# Patient Record
Sex: Female | Born: 1965
Health system: Southern US, Community
[De-identification: ages and names within clinical notes are randomized; demographics above are authoritative.]

## PROBLEM LIST (undated history)

## (undated) DIAGNOSIS — F32A Depression, unspecified: Secondary | ICD-10-CM

## (undated) DIAGNOSIS — I10 Essential (primary) hypertension: Secondary | ICD-10-CM

## (undated) DIAGNOSIS — E785 Hyperlipidemia, unspecified: Secondary | ICD-10-CM

## (undated) DIAGNOSIS — E119 Type 2 diabetes mellitus without complications: Secondary | ICD-10-CM

## (undated) DIAGNOSIS — T7840XA Allergy, unspecified, initial encounter: Secondary | ICD-10-CM

## (undated) DIAGNOSIS — F419 Anxiety disorder, unspecified: Secondary | ICD-10-CM

## (undated) DIAGNOSIS — Z860100 Personal history of colon polyps, unspecified: Secondary | ICD-10-CM

## (undated) DIAGNOSIS — F329 Major depressive disorder, single episode, unspecified: Secondary | ICD-10-CM

## (undated) DIAGNOSIS — Z9889 Other specified postprocedural states: Secondary | ICD-10-CM

## (undated) HISTORY — DX: Hyperlipidemia, unspecified: E78.5

## (undated) HISTORY — DX: Personal history of colon polyps, unspecified: Z86.0100

## (undated) HISTORY — DX: Allergy, unspecified, initial encounter: T78.40XA

## (undated) HISTORY — DX: Other specified postprocedural states: Z98.890

## (undated) HISTORY — DX: Major depressive disorder, single episode, unspecified: F32.9

## (undated) HISTORY — DX: Anxiety disorder, unspecified: F41.9

## (undated) HISTORY — DX: Type 2 diabetes mellitus without complications: E11.9

## (undated) HISTORY — DX: Depression, unspecified: F32.A

## (undated) HISTORY — PX: CARPAL TUNNEL RELEASE: SHX101

## (undated) HISTORY — DX: Essential (primary) hypertension: I10

---
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
## 1990-04-30 HISTORY — PX: TONSILLECTOMY: SUR1361

## 1998-04-11 ENCOUNTER — Other Ambulatory Visit: Admission: RE | Admit: 1998-04-11 | Discharge: 1998-04-11 | Payer: Self-pay | Admitting: Obstetrics and Gynecology

## 1999-05-26 ENCOUNTER — Other Ambulatory Visit: Admission: RE | Admit: 1999-05-26 | Discharge: 1999-05-26 | Payer: Self-pay | Admitting: Obstetrics and Gynecology

## 1999-07-21 ENCOUNTER — Encounter: Payer: Self-pay | Admitting: Obstetrics and Gynecology

## 1999-07-21 ENCOUNTER — Ambulatory Visit (HOSPITAL_COMMUNITY): Admission: RE | Admit: 1999-07-21 | Discharge: 1999-07-21 | Payer: Self-pay | Admitting: Obstetrics and Gynecology

## 1999-09-20 ENCOUNTER — Ambulatory Visit (HOSPITAL_COMMUNITY): Admission: RE | Admit: 1999-09-20 | Discharge: 1999-09-20 | Payer: Self-pay | Admitting: Obstetrics and Gynecology

## 1999-09-29 ENCOUNTER — Encounter: Admission: RE | Admit: 1999-09-29 | Discharge: 1999-12-28 | Payer: Self-pay | Admitting: Obstetrics and Gynecology

## 1999-11-30 ENCOUNTER — Inpatient Hospital Stay (HOSPITAL_COMMUNITY): Admission: AD | Admit: 1999-11-30 | Discharge: 1999-11-30 | Payer: Self-pay | Admitting: Obstetrics and Gynecology

## 1999-12-05 ENCOUNTER — Encounter (HOSPITAL_COMMUNITY): Admission: RE | Admit: 1999-12-05 | Discharge: 1999-12-08 | Payer: Self-pay | Admitting: Obstetrics and Gynecology

## 1999-12-08 ENCOUNTER — Inpatient Hospital Stay (HOSPITAL_COMMUNITY): Admission: AD | Admit: 1999-12-08 | Discharge: 1999-12-11 | Payer: Self-pay | Admitting: Obstetrics and Gynecology

## 2000-05-07 ENCOUNTER — Other Ambulatory Visit: Admission: RE | Admit: 2000-05-07 | Discharge: 2000-05-07 | Payer: Self-pay | Admitting: Obstetrics and Gynecology

## 2001-07-01 ENCOUNTER — Other Ambulatory Visit: Admission: RE | Admit: 2001-07-01 | Discharge: 2001-07-01 | Payer: Self-pay | Admitting: Obstetrics and Gynecology

## 2002-04-29 ENCOUNTER — Ambulatory Visit (HOSPITAL_BASED_OUTPATIENT_CLINIC_OR_DEPARTMENT_OTHER): Admission: RE | Admit: 2002-04-29 | Discharge: 2002-04-29 | Payer: Self-pay | Admitting: Orthopedic Surgery

## 2002-08-12 ENCOUNTER — Ambulatory Visit (HOSPITAL_BASED_OUTPATIENT_CLINIC_OR_DEPARTMENT_OTHER): Admission: RE | Admit: 2002-08-12 | Discharge: 2002-08-12 | Payer: Self-pay | Admitting: Orthopedic Surgery

## 2010-02-28 ENCOUNTER — Ambulatory Visit: Payer: Self-pay

## 2011-06-28 ENCOUNTER — Ambulatory Visit: Payer: Self-pay

## 2014-08-03 ENCOUNTER — Encounter: Payer: Self-pay | Admitting: *Deleted

## 2015-05-25 DIAGNOSIS — I10 Essential (primary) hypertension: Secondary | ICD-10-CM | POA: Diagnosis not present

## 2015-05-25 DIAGNOSIS — F418 Other specified anxiety disorders: Secondary | ICD-10-CM | POA: Diagnosis not present

## 2015-05-25 DIAGNOSIS — E784 Other hyperlipidemia: Secondary | ICD-10-CM | POA: Diagnosis not present

## 2015-05-25 DIAGNOSIS — E119 Type 2 diabetes mellitus without complications: Secondary | ICD-10-CM | POA: Diagnosis not present

## 2015-05-25 DIAGNOSIS — Z79899 Other long term (current) drug therapy: Secondary | ICD-10-CM | POA: Diagnosis not present

## 2015-05-31 DIAGNOSIS — E784 Other hyperlipidemia: Secondary | ICD-10-CM | POA: Diagnosis not present

## 2015-06-14 DIAGNOSIS — H524 Presbyopia: Secondary | ICD-10-CM | POA: Diagnosis not present

## 2015-09-05 ENCOUNTER — Encounter: Payer: Self-pay | Admitting: Physician Assistant

## 2015-09-05 ENCOUNTER — Ambulatory Visit: Payer: Self-pay | Admitting: Physician Assistant

## 2015-09-05 VITALS — BP 114/80 | HR 76 | Temp 98.9°F

## 2015-09-05 DIAGNOSIS — H6981 Other specified disorders of Eustachian tube, right ear: Secondary | ICD-10-CM

## 2015-09-05 MED ORDER — FLUTICASONE PROPIONATE 50 MCG/ACT NA SUSP: 2.0000 | Freq: Every day | NASAL | Status: DC

## 2015-09-05 MED ORDER — PREDNISONE 10 MG PO TABS: 30.0000 mg | ORAL_TABLET | Freq: Every day | ORAL | Status: DC

## 2015-09-05 NOTE — Progress Notes (Signed)
S:  C/o ears popping and being stopped up, no drainage from ears, no fever/chills, no cough or congestion, some sinus pressure, can't hear hardly at all from r ear;  remainder ros neg Using otc meds without relief  O:  Vitals wnl, nad, r ear canal + large amount of wax but not impacted, r tm upper rim is dull,  Left tm is a little dull, nasal mucosa swollen, throat wnl, neck supple no lymph, lungs c t a, cv rrr, neuro intact, irrigated r ear canal, wax removed  A: acute eustachean tube dysfunction, ear wax removal  P: flonase, children's dose sudafed, prednisone 30mg  qd x 3d, return if not improving in 3 to 5 days, return earlier if worsening

## 2015-12-14 DIAGNOSIS — Z1231 Encounter for screening mammogram for malignant neoplasm of breast: Secondary | ICD-10-CM | POA: Diagnosis not present

## 2015-12-14 DIAGNOSIS — Z130 Encounter for screening for diseases of the blood and blood-forming organs and certain disorders involving the immune mechanism: Secondary | ICD-10-CM | POA: Diagnosis not present

## 2015-12-14 DIAGNOSIS — Z01419 Encounter for gynecological examination (general) (routine) without abnormal findings: Secondary | ICD-10-CM | POA: Diagnosis not present

## 2015-12-14 DIAGNOSIS — Z124 Encounter for screening for malignant neoplasm of cervix: Secondary | ICD-10-CM | POA: Diagnosis not present

## 2015-12-14 DIAGNOSIS — Z3041 Encounter for surveillance of contraceptive pills: Secondary | ICD-10-CM | POA: Diagnosis not present

## 2015-12-14 DIAGNOSIS — Z1389 Encounter for screening for other disorder: Secondary | ICD-10-CM | POA: Diagnosis not present

## 2015-12-28 DIAGNOSIS — I10 Essential (primary) hypertension: Secondary | ICD-10-CM | POA: Diagnosis not present

## 2015-12-28 DIAGNOSIS — E784 Other hyperlipidemia: Secondary | ICD-10-CM | POA: Diagnosis not present

## 2015-12-28 DIAGNOSIS — F418 Other specified anxiety disorders: Secondary | ICD-10-CM | POA: Diagnosis not present

## 2015-12-28 DIAGNOSIS — E119 Type 2 diabetes mellitus without complications: Secondary | ICD-10-CM | POA: Diagnosis not present

## 2015-12-28 DIAGNOSIS — Z79899 Other long term (current) drug therapy: Secondary | ICD-10-CM | POA: Diagnosis not present

## 2016-04-11 DIAGNOSIS — E119 Type 2 diabetes mellitus without complications: Secondary | ICD-10-CM | POA: Diagnosis not present

## 2016-04-11 DIAGNOSIS — F418 Other specified anxiety disorders: Secondary | ICD-10-CM | POA: Diagnosis not present

## 2016-04-11 DIAGNOSIS — Z7984 Long term (current) use of oral hypoglycemic drugs: Secondary | ICD-10-CM | POA: Diagnosis not present

## 2016-05-02 ENCOUNTER — Encounter: Payer: Self-pay | Admitting: Physician Assistant

## 2016-05-02 ENCOUNTER — Ambulatory Visit: Payer: Self-pay | Admitting: Physician Assistant

## 2016-05-02 VITALS — BP 119/70 | HR 100 | Temp 98.4°F

## 2016-05-02 DIAGNOSIS — H65191 Other acute nonsuppurative otitis media, right ear: Secondary | ICD-10-CM

## 2016-05-02 MED ORDER — AMOXICILLIN-POT CLAVULANATE 875-125 MG PO TABS: 1.0000 | ORAL_TABLET | Freq: Two times a day (BID) | ORAL | 0 refills | Status: DC

## 2016-05-02 NOTE — Progress Notes (Signed)
S: c/o r ear pain with some drainage from ear that has a bad odor, mild low grade fever, no body aches, no cough or congestion, did have a cold a couple of weeks ago but now is better, , remainder ros negative  O: vitals wnl, nad, r tm dull with yellow pus, none in ear canal, left tm is dull, nasal mucosa a little swollen, throat wnl, neck supple no lymph, lungs c t a, cv rrr  A: acute otitis media  P: augmentin 875mg  bid, diflucan

## 2016-06-06 ENCOUNTER — Ambulatory Visit: Payer: Self-pay | Admitting: Physician Assistant

## 2016-06-06 VITALS — BP 110/70 | HR 80 | Temp 99.0°F

## 2016-06-06 DIAGNOSIS — H6982 Other specified disorders of Eustachian tube, left ear: Secondary | ICD-10-CM

## 2016-06-06 DIAGNOSIS — R509 Fever, unspecified: Secondary | ICD-10-CM

## 2016-06-06 LAB — POCT INFLUENZA A/B
INFLUENZA A, POC: NEGATIVE
INFLUENZA B, POC: NEGATIVE

## 2016-06-06 MED ORDER — PREDNISONE 10 MG PO TABS
30.0000 mg | ORAL_TABLET | Freq: Every day | ORAL | 0 refills | Status: DC
Start: 2016-06-06 — End: 2016-09-19

## 2016-06-06 NOTE — Progress Notes (Signed)
S:  C/o ears popping and being stopped up, no drainage from ears, no fever/chills, did have cough and congestion last week, some sinus pressure, no body aches or sore throat,  remainder ros neg Using otc meds without relief  O:  Vitals w low grade temp, nad, tms dull b/l, nasal mucosa swollen, throat wnl, neck supple no lymph, lungs c t a, cv rrr, neuro intact, flu swab neg  A: acute eustachean tube dysfunction  P: flonase, sudafed, prednisone 30mg  qd x 3d, return if not improving in 3 to 5 days, return earlier if worsening, if improves with 3 day burst but sx worsen when stopping, can call in steroid pack

## 2016-07-18 DIAGNOSIS — Z79899 Other long term (current) drug therapy: Secondary | ICD-10-CM | POA: Diagnosis not present

## 2016-07-18 DIAGNOSIS — E119 Type 2 diabetes mellitus without complications: Secondary | ICD-10-CM | POA: Diagnosis not present

## 2016-07-18 DIAGNOSIS — I10 Essential (primary) hypertension: Secondary | ICD-10-CM | POA: Diagnosis not present

## 2016-07-18 DIAGNOSIS — E784 Other hyperlipidemia: Secondary | ICD-10-CM | POA: Diagnosis not present

## 2016-07-18 DIAGNOSIS — Z7984 Long term (current) use of oral hypoglycemic drugs: Secondary | ICD-10-CM | POA: Diagnosis not present

## 2016-08-22 DIAGNOSIS — E784 Other hyperlipidemia: Secondary | ICD-10-CM | POA: Diagnosis not present

## 2016-08-22 DIAGNOSIS — E119 Type 2 diabetes mellitus without complications: Secondary | ICD-10-CM | POA: Diagnosis not present

## 2016-08-22 DIAGNOSIS — Z79899 Other long term (current) drug therapy: Secondary | ICD-10-CM | POA: Diagnosis not present

## 2016-08-29 DIAGNOSIS — N6321 Unspecified lump in the left breast, upper outer quadrant: Secondary | ICD-10-CM | POA: Diagnosis not present

## 2016-08-30 ENCOUNTER — Other Ambulatory Visit: Payer: Self-pay | Admitting: Obstetrics and Gynecology

## 2016-08-30 DIAGNOSIS — N63 Unspecified lump in unspecified breast: Secondary | ICD-10-CM

## 2016-09-04 ENCOUNTER — Ambulatory Visit
Admission: RE | Admit: 2016-09-04 | Discharge: 2016-09-04 | Disposition: A | Discharge status: Home / Self Care | Payer: Self-pay | Source: Ambulatory Visit | Attending: Obstetrics and Gynecology | Admitting: Obstetrics and Gynecology

## 2016-09-04 DIAGNOSIS — R928 Other abnormal and inconclusive findings on diagnostic imaging of breast: Secondary | ICD-10-CM | POA: Diagnosis not present

## 2016-09-04 DIAGNOSIS — N6489 Other specified disorders of breast: Secondary | ICD-10-CM | POA: Diagnosis not present

## 2016-09-04 DIAGNOSIS — N63 Unspecified lump in unspecified breast: Secondary | ICD-10-CM

## 2016-09-12 ENCOUNTER — Encounter: Payer: Self-pay | Admitting: General Surgery

## 2016-09-18 ENCOUNTER — Encounter: Payer: Self-pay | Admitting: *Deleted

## 2016-09-19 ENCOUNTER — Inpatient Hospital Stay: Payer: Self-pay

## 2016-09-19 ENCOUNTER — Ambulatory Visit (INDEPENDENT_AMBULATORY_CARE_PROVIDER_SITE_OTHER): Payer: 59 | Admitting: General Surgery

## 2016-09-19 ENCOUNTER — Encounter: Payer: Self-pay | Admitting: General Surgery

## 2016-09-19 VITALS — BP 150/90 | HR 98 | Resp 12 | Ht 63.0 in | Wt 197.0 lb

## 2016-09-19 DIAGNOSIS — N6321 Unspecified lump in the left breast, upper outer quadrant: Secondary | ICD-10-CM

## 2016-09-19 DIAGNOSIS — N6320 Unspecified lump in the left breast, unspecified quadrant: Secondary | ICD-10-CM

## 2016-09-19 NOTE — Patient Instructions (Addendum)
Follow up in 3 months with office ultrasound, unless area becomes symptomatic. The patient is aware to call back for any questions or concerns.

## 2016-09-19 NOTE — Progress Notes (Signed)
Patient ID: Tracy Burgess, female   DOB: 12/04/1965, 51 y.o.   MRN: [id number]  Chief Complaint  Patient presents with  . Mass    HPI Tracy Burgess is a 51 y.o. female.  who presents for a breast evaluation. The most recent mammogram and left breast ultrasound was done on 09-04-16. She noticed a left breast mass at the ned of April about the size of an M & M. No pain to the breast. Patient does perform regular self breast checks and gets regular mammograms done.   There is no history of trauma. The patient is a nurse in the endoscopy unit.  HPI  Past Medical History:  Diagnosis Date  . Allergy   . Anxiety   . Depression   . Diabetes mellitus without complication ([place])   . Hyperlipidemia   . Hypertension     Past Surgical History:  Procedure Laterality Date  . CARPAL TUNNEL RELEASE Bilateral [id number]  . CESAREAN SECTION  [id number]  . TONSILLECTOMY  1992  . tubs in ears  1971    Family History  Problem Relation Age of Onset  . Breast cancer Paternal Grandmother 48  . Diabetes Father     Social History Social History  Substance Use Topics  . Smoking status: Never Smoker  . Smokeless tobacco: Never Used  . Alcohol use 0.0 oz/week    Allergies  Allergen Reactions  . Sulfamethoxazole     Other reaction(s): Other (See Comments) A drop in blood sugar    Current Outpatient Prescriptions  Medication Sig Dispense Refill  . fenofibrate 54 MG tablet Take 54 mg by mouth daily.   5  . fluticasone (FLONASE) 50 MCG/ACT nasal spray Place 2 sprays into both nostrils daily. 16 g 6  . glipiZIDE (GLUCOTROL) 5 MG tablet Take 5 mg by mouth daily before breakfast.   5  . ibuprofen (ADVIL,MOTRIN) 200 MG tablet Take 200 mg by mouth every 6 (six) hours as needed.    Marland Kitchen lisinopril-hydrochlorothiazide (PRINZIDE,ZESTORETIC) 20-12.5 MG tablet Take 1 tablet by mouth daily.   5  . metFORMIN (GLUCOPHAGE-XR) 500 MG 24 hr tablet Take 1,000 mg by mouth daily with breakfast.   1  .  norethindrone (MICRONOR,CAMILA,ERRIN) 0.35 MG tablet Take 1 tablet by mouth daily.   13  . simvastatin (ZOCOR) 80 MG tablet Take 80 mg by mouth daily at 6 PM.   5  . venlafaxine XR (EFFEXOR-XR) 150 MG 24 hr capsule Take 150 mg by mouth daily with breakfast.   5   No current facility-administered medications for this visit.     Review of Systems Review of Systems  Constitutional: Negative.   Cardiovascular: Negative.     Blood pressure (!) 150/90, pulse 98, resp. rate 12, height 5\' 3"  (1.6 m), weight 197 lb (89.4 kg), last menstrual period 08/20/2016, SpO2 98 %.  Physical Exam Physical Exam  Constitutional: She is oriented to person, place, and time. She appears well-developed and well-nourished.  HENT:  Mouth/Throat: Oropharynx is clear and moist.  Eyes: Conjunctivae are normal. No scleral icterus.  Neck: Neck supple.  Cardiovascular: Normal rate, regular rhythm and normal heart sounds.   Pulmonary/Chest: Effort normal and breath sounds normal. Right breast exhibits no inverted nipple, no mass, no nipple discharge, no skin change and no tenderness. Left breast exhibits mass. Left breast exhibits no inverted nipple, no nipple discharge, no skin change and no tenderness.    Thickening right breast upper outer quadrant.   Lymphadenopathy:  She has no cervical adenopathy.    She has no axillary adenopathy.  Neurological: She is alert and oriented to person, place, and time.  Skin: Skin is warm and dry.  Psychiatric: Her behavior is normal.    Data Reviewed 09/04/2016 left breast diagnostic mammogram and ultrasound reviewed. Compared to the 06/28/2011 images in the retrieval system there is some consolidation of the residual breast parenchyma in the upper-outer quadrant of the left breast. No discernible nodularity. Ultrasound report is negative (no images).  Considering the focal thickening evident on clinical exam was elected to repeat the ultrasound. In the left breast at the  2:00 position, 9 cm from the nipple there is a hyperechoic area in the subcutaneous fat measuring up to 0.5 cm in diameter. This is above the level of the adjacent rest parenchyma. No increased vascular flow. In the seated position this area is more distinct measuring up to 0.74 cm, again isoechoic/hyperechoic with the adjacent tissue. This likely represents a focal area of inflamed subcutaneous fat. BI-RADS-3.    Assessment    Focal hyperechoic fat without discernible abnormality of the underlying breast parenchyma.    Plan    Observation versus surgical excision discussed, based on the ultrasound appearance would recommend the former. The importance of not manipulating the area to minimize persistent inflammation was reviewed.    Follow up in 3 months with office ultrasound, unless area becomes symptomatic. The patient is aware to call back for any questions or concerns.    HPI, Physical Exam, Assessment and Plan have been scribed under the direction and in the presence of Robert Bellow, MD.  Karie Fetch, RN  I have completed the exam and reviewed the above documentation for accuracy and completeness.  I agree with the above.  Haematologist has been used and any errors in dictation or transcription are unintentional.  Hervey Ard, M.D., F.A.C.S.  Robert Bellow 09/20/2016, 8:43 PM

## 2016-09-20 DIAGNOSIS — N6320 Unspecified lump in the left breast, unspecified quadrant: Secondary | ICD-10-CM | POA: Insufficient documentation

## 2016-09-25 ENCOUNTER — Encounter: Payer: Self-pay | Admitting: *Deleted

## 2016-11-06 ENCOUNTER — Ambulatory Visit: Payer: Self-pay | Admitting: Family

## 2016-12-19 DIAGNOSIS — Z3041 Encounter for surveillance of contraceptive pills: Secondary | ICD-10-CM | POA: Diagnosis not present

## 2016-12-19 DIAGNOSIS — Z1231 Encounter for screening mammogram for malignant neoplasm of breast: Secondary | ICD-10-CM | POA: Diagnosis not present

## 2016-12-19 DIAGNOSIS — Z1389 Encounter for screening for other disorder: Secondary | ICD-10-CM | POA: Diagnosis not present

## 2016-12-19 DIAGNOSIS — Z124 Encounter for screening for malignant neoplasm of cervix: Secondary | ICD-10-CM | POA: Diagnosis not present

## 2016-12-19 DIAGNOSIS — Z130 Encounter for screening for diseases of the blood and blood-forming organs and certain disorders involving the immune mechanism: Secondary | ICD-10-CM | POA: Diagnosis not present

## 2016-12-19 DIAGNOSIS — Z01419 Encounter for gynecological examination (general) (routine) without abnormal findings: Secondary | ICD-10-CM | POA: Diagnosis not present

## 2016-12-20 DIAGNOSIS — Z124 Encounter for screening for malignant neoplasm of cervix: Secondary | ICD-10-CM | POA: Diagnosis not present

## 2016-12-26 ENCOUNTER — Encounter: Payer: Self-pay | Admitting: General Surgery

## 2016-12-26 ENCOUNTER — Ambulatory Visit (INDEPENDENT_AMBULATORY_CARE_PROVIDER_SITE_OTHER): Payer: 59 | Admitting: General Surgery

## 2016-12-26 VITALS — BP 124/82 | HR 76 | Resp 12 | Ht 63.0 in | Wt 195.0 lb

## 2016-12-26 DIAGNOSIS — N6321 Unspecified lump in the left breast, upper outer quadrant: Secondary | ICD-10-CM | POA: Diagnosis not present

## 2016-12-26 NOTE — Progress Notes (Signed)
Patient ID: Tracy Burgess, female   DOB: 09/03/1965, 51 y.o.   MRN: [id number]  Chief Complaint  Patient presents with  . Follow-up    HPI Tracy Burgess is a 51 y.o. female here today for her moth follow up left breast mass. Patient states no change in size or pain. Patient had a mammogram on 06/01/2016 in [place]  HPI  Past Medical History:  Diagnosis Date  . Allergy   . Anxiety   . Depression   . Diabetes mellitus without complication ([place])   . Hyperlipidemia   . Hypertension     Past Surgical History:  Procedure Laterality Date  . CARPAL TUNNEL RELEASE Bilateral [id number]  . CESAREAN SECTION  [id number]  . TONSILLECTOMY  1992  . tubes in ears  1971    Family History  Problem Relation Age of Onset  . Breast cancer Paternal Grandmother 52  . Diabetes Father     Social History Social History  Substance Use Topics  . Smoking status: Never Smoker  . Smokeless tobacco: Never Used  . Alcohol use 0.0 oz/week    Allergies  Allergen Reactions  . Sulfamethoxazole     Other reaction(s): Other (See Comments) A drop in blood sugar    Current Outpatient Prescriptions  Medication Sig Dispense Refill  . fenofibrate 54 MG tablet Take 54 mg by mouth daily.   5  . fluticasone (FLONASE) 50 MCG/ACT nasal spray Place 2 sprays into both nostrils daily. 16 g 6  . glipiZIDE (GLUCOTROL) 5 MG tablet Take 5 mg by mouth daily before breakfast.   5  . ibuprofen (ADVIL,MOTRIN) 200 MG tablet Take 200 mg by mouth every 6 (six) hours as needed.    Marland Kitchen lisinopril-hydrochlorothiazide (PRINZIDE,ZESTORETIC) 20-12.5 MG tablet Take 1 tablet by mouth daily.   5  . metFORMIN (GLUCOPHAGE-XR) 500 MG 24 hr tablet Take 1,000 mg by mouth daily with breakfast.   1  . norethindrone (MICRONOR,CAMILA,ERRIN) 0.35 MG tablet Take 1 tablet by mouth daily.   13  . simvastatin (ZOCOR) 80 MG tablet Take 80 mg by mouth daily at 6 PM.   5  . venlafaxine XR (EFFEXOR-XR) 150 MG 24 hr capsule Take 150 mg by mouth  daily with breakfast.   5   No current facility-administered medications for this visit.     Review of Systems Review of Systems  Blood pressure 124/82, pulse 76, resp. rate 12, height 5\' 3"  (1.6 m), weight 195 lb (88.5 kg).  Physical Exam Physical Exam  Constitutional: She is oriented to person, place, and time. She appears well-developed and well-nourished.  Eyes: Conjunctivae are normal. No scleral icterus.  Cardiovascular: Normal rate, regular rhythm and normal heart sounds.   Pulmonary/Chest: Effort normal and breath sounds normal. Right breast exhibits no inverted nipple, no mass, no nipple discharge, no skin change and no tenderness. Left breast exhibits mass. Left breast exhibits no inverted nipple, no nipple discharge, no skin change and no tenderness.    Abdominal: There is no tenderness.  Neurological: She is alert and oriented to person, place, and time.  Skin: Skin is warm and dry.    Data Reviewed Bilateral screening mammograms completed at the [facility] office dated 12/20/2006 reported as normal. BI-RADS-1. Films not available for review. Prior review of a left breast diagnostic mammogram and hospital-based ultrasound in May 2018 had been unremarkable. Office ultrasound of May 2018 showed hyperechoic tissue in the area of palpable thickening thought to represent traumatized fat.  Assessment  Benign breast exam.  Normal mammogram by report.    Plan    Patient will continue monthly self examinations and report of any change in the area occurs. No indication for biopsy at present.   Patient to return as needed. The patient is aware to call back for any questions or concerns.   HPI, Physical Exam, Assessment and Plan have been scribed under the direction and in the presence of Hervey Ard, MD.  Gaspar Cola, CMA  I have completed the exam and reviewed the above documentation for accuracy and completeness.  I agree with the above.  Development worker, community has been used and any errors in dictation or transcription are unintentional.  Hervey Ard, M.D., F.A.C.S.  Robert Bellow 12/27/2016, 7:46 AM

## 2016-12-26 NOTE — Patient Instructions (Signed)
Patient to return as needed. The patient is aware to call back for any questions or concerns. 

## 2016-12-27 ENCOUNTER — Encounter: Payer: Self-pay | Admitting: General Surgery

## 2017-01-02 DIAGNOSIS — F329 Major depressive disorder, single episode, unspecified: Secondary | ICD-10-CM | POA: Diagnosis not present

## 2017-01-02 DIAGNOSIS — E785 Hyperlipidemia, unspecified: Secondary | ICD-10-CM | POA: Diagnosis not present

## 2017-01-02 DIAGNOSIS — I10 Essential (primary) hypertension: Secondary | ICD-10-CM | POA: Diagnosis not present

## 2017-01-02 DIAGNOSIS — F419 Anxiety disorder, unspecified: Secondary | ICD-10-CM | POA: Diagnosis not present

## 2017-01-02 DIAGNOSIS — R5383 Other fatigue: Secondary | ICD-10-CM | POA: Diagnosis not present

## 2017-01-02 DIAGNOSIS — E119 Type 2 diabetes mellitus without complications: Secondary | ICD-10-CM | POA: Diagnosis not present

## 2017-01-08 DIAGNOSIS — R946 Abnormal results of thyroid function studies: Secondary | ICD-10-CM | POA: Diagnosis not present

## 2017-01-08 DIAGNOSIS — I10 Essential (primary) hypertension: Secondary | ICD-10-CM | POA: Diagnosis not present

## 2017-01-08 DIAGNOSIS — E119 Type 2 diabetes mellitus without complications: Secondary | ICD-10-CM | POA: Diagnosis not present

## 2017-01-08 DIAGNOSIS — R5383 Other fatigue: Secondary | ICD-10-CM | POA: Diagnosis not present

## 2017-01-08 DIAGNOSIS — E785 Hyperlipidemia, unspecified: Secondary | ICD-10-CM | POA: Diagnosis not present

## 2017-01-22 DIAGNOSIS — F411 Generalized anxiety disorder: Secondary | ICD-10-CM | POA: Diagnosis not present

## 2017-01-22 DIAGNOSIS — E785 Hyperlipidemia, unspecified: Secondary | ICD-10-CM | POA: Diagnosis not present

## 2017-01-22 DIAGNOSIS — E1165 Type 2 diabetes mellitus with hyperglycemia: Secondary | ICD-10-CM | POA: Diagnosis not present

## 2017-01-22 DIAGNOSIS — F329 Major depressive disorder, single episode, unspecified: Secondary | ICD-10-CM | POA: Diagnosis not present

## 2017-01-22 DIAGNOSIS — I10 Essential (primary) hypertension: Secondary | ICD-10-CM | POA: Diagnosis not present

## 2017-01-22 DIAGNOSIS — E039 Hypothyroidism, unspecified: Secondary | ICD-10-CM | POA: Diagnosis not present

## 2017-01-22 DIAGNOSIS — H6691 Otitis media, unspecified, right ear: Secondary | ICD-10-CM | POA: Diagnosis not present

## 2017-04-02 DIAGNOSIS — E038 Other specified hypothyroidism: Secondary | ICD-10-CM | POA: Diagnosis not present

## 2017-09-16 ENCOUNTER — Ambulatory Visit: Payer: Self-pay | Admitting: Adult Health

## 2017-09-16 ENCOUNTER — Encounter: Payer: Self-pay | Admitting: Adult Health

## 2017-09-16 VITALS — BP 130/74 | HR 93 | Temp 99.2°F | Wt 194.0 lb

## 2017-09-16 DIAGNOSIS — N390 Urinary tract infection, site not specified: Secondary | ICD-10-CM

## 2017-09-16 DIAGNOSIS — R81 Glycosuria: Secondary | ICD-10-CM | POA: Insufficient documentation

## 2017-09-16 DIAGNOSIS — R300 Dysuria: Secondary | ICD-10-CM

## 2017-09-16 DIAGNOSIS — R319 Hematuria, unspecified: Secondary | ICD-10-CM

## 2017-09-16 DIAGNOSIS — I10 Essential (primary) hypertension: Secondary | ICD-10-CM | POA: Insufficient documentation

## 2017-09-16 LAB — POCT URINALYSIS DIPSTICK
Bilirubin, UA: NEGATIVE
GLUCOSE UA: POSITIVE — AB
Ketones, UA: NEGATIVE
NITRITE UA: NEGATIVE
PROTEIN UA: POSITIVE — AB
Urobilinogen, UA: 0.2 E.U./dL
pH, UA: 5.5 (ref 5.0–8.0)

## 2017-09-16 MED ORDER — NITROFURANTOIN MONOHYD MACRO 100 MG PO CAPS
100.0000 mg | ORAL_CAPSULE | Freq: Two times a day (BID) | ORAL | 0 refills | Status: DC
Start: 2017-09-16 — End: 2020-02-02

## 2017-09-16 NOTE — Patient Instructions (Addendum)
Urinary Frequency, Adult Urinary frequency means urinating more often than usual. People with urinary frequency urinate at least 8 times in 24 hours, even if they drink a normal amount of fluid. Although they urinate more often than normal, the total amount of urine produced in a day may be normal. Urinary frequency is also called pollakiuria. What are the causes? This condition may be caused by:  A urinary tract infection.  Obesity.  Bladder problems, such as bladder stones.  Caffeine or alcohol.  Eating food or drinking fluids that irritate the bladder. These include coffee, tea, soda, artificial sweeteners, citrus, tomato-based foods, and chocolate.  Certain medicines, such as medicines that help the body get rid of extra fluid (diuretics).  Muscle or nerve weakness.  Overactive bladder.  Chronic diabetes.  Interstitial cystitis.  In men, problems with the prostate, such as an enlarged prostate.  In women, pregnancy.  In some cases, the cause may not be known. What increases the risk? This condition is more likely to develop in:  Women who have gone through menopause.  Men with prostate problems.  People with a disease or injury that affects the nerves or spinal cord.  People who have or have had a condition that affects the brain, such as a stroke.  What are the signs or symptoms? Symptoms of this condition include:  Feeling an urgent need to urinate often. The stress and anxiety of needing to find a bathroom quickly can make this urge worse.  Urinating 8 or more times in 24 hours.  Urinating as often as every 1 to 2 hours.  How is this diagnosed? This condition is diagnosed based on your symptoms, your medical history, and a physical exam. You may have tests, such as:  Blood tests.  Urine tests.  Imaging tests, such as X-rays or ultrasounds.  A bladder test.  A test of your neurological system. This is the body system that senses the need to  urinate.  A test to check for problems in the urethra and bladder called cystoscopy.  You may also be asked to keep a bladder diary. A bladder diary is a record of what you eat and drink, how often you urinate, and how much you urinate. You may need to see a health care provider who specializes in conditions of the urinary tract (urologist) or kidneys (nephrologist). How is this treated? Treatment for this condition depends on the cause. Sometimes the condition goes away on its own and treatment is not necessary. If treatment is needed, it may include:  Taking medicine.  Learning exercises that strengthen the muscles that help control urination.  Following a bladder training program. This may include: ? Learning to delay going to the bathroom. ? Double urinating (voiding). This helps if you are not completely emptying your bladder. ? Scheduled voiding.  Making diet changes, such as: ? Avoiding caffeine. ? Drinking fewer fluids, especially alcohol. ? Not drinking in the evening. ? Not having foods or drinks that may irritate the bladder. ? Eating foods that help prevent or ease constipation. Constipation can make this condition worse.  Having the nerves in your bladder stimulated. There are two options for stimulating the nerves to your bladder: ? Outpatient electrical nerve stimulation. This is done by your health care provider. ? Surgery to implant a bladder pacemaker. The pacemaker helps to control the urge to urinate.  Follow these instructions at home:  Keep a bladder diary if told to by your health care provider.  Take over-the-counter   and prescription medicines only as told by your health care provider.  Do any exercises as told by your health care provider.  Follow a bladder training program as told by your health care provider.  Make any recommended diet changes.  Keep all follow-up visits as told by your health care provider. This is important. Contact a health care  provider if:  You start urinating more often.  You feel pain or irritation when you urinate.  You notice blood in your urine.  Your urine looks cloudy.  You develop a fever.  You begin vomiting. Get help right away if:  You are unable to urinate. This information is not intended to replace advice given to you by your health care provider. Make sure you discuss any questions you have with your health care provider. Document Released: 02/10/2009 Document Revised: 05/18/2015 Document Reviewed: 11/10/2014 Elsevier Interactive Patient Education  2018 [employer]. Nitrofurantoin tablets or capsules What is this medicine? NITROFURANTOIN (nye troe fyoor AN toyn) is an antibiotic. It is used to treat urinary tract infections. This medicine may be used for other purposes; ask your health care provider or pharmacist if you have questions. COMMON BRAND NAME(S): Macrobid, Macrodantin, Urotoin What should I tell my health care provider before I take this medicine? They need to know if you have any of these conditions: -anemia -diabetes -glucose-6-phosphate dehydrogenase deficiency -kidney disease -liver disease -lung disease -other chronic illness -an unusual or allergic reaction to nitrofurantoin, other antibiotics, other medicines, foods, dyes or preservatives -pregnant or trying to get pregnant -breast-feeding How should I use this medicine? Take this medicine by mouth with a glass of water. Follow the directions on the prescription label. Take this medicine with food or milk. Take your doses at regular intervals. Do not take your medicine more often than directed. Do not stop taking except on your doctor's advice. Talk to your pediatrician regarding the use of this medicine in children. While this drug may be prescribed for selected conditions, precautions do apply. Overdosage: If you think you have taken too much of this medicine contact a poison control center or emergency room at  once. NOTE: This medicine is only for you. Do not share this medicine with others. What if I miss a dose? If you miss a dose, take it as soon as you can. If it is almost time for your next dose, take only that dose. Do not take double or extra doses. What may interact with this medicine? -antacids containing magnesium trisilicate -probenecid -quinolone antibiotics like ciprofloxacin, lomefloxacin, norfloxacin and ofloxacin -sulfinpyrazone This list may not describe all possible interactions. Give your health care provider a list of all the medicines, herbs, non-prescription drugs, or dietary supplements you use. Also tell them if you smoke, drink alcohol, or use illegal drugs. Some items may interact with your medicine. What should I watch for while using this medicine? Tell your doctor or health care professional if your symptoms do not improve or if you get new symptoms. Drink several glasses of water a day. If you are taking this medicine for a long time, visit your doctor for regular checks on your progress. If you are diabetic, you may get a false positive result for sugar in your urine with certain brands of urine tests. Check with your doctor. What side effects may I notice from receiving this medicine? Side effects that you should report to your doctor or health care professional as soon as possible: -allergic reactions like skin rash or hives, swelling  of the face, lips, or tongue -chest pain -cough -difficulty breathing -dizziness, drowsiness -fever or infection -joint aches or pains -pale or blue-tinted skin -redness, blistering, peeling or loosening of the skin, including inside the mouth -tingling, burning, pain, or numbness in hands or feet -unusual bleeding or bruising -unusually weak or tired -yellowing of eyes or skin Side effects that usually do not require medical attention (report to your doctor or health care professional if they continue or are bothersome): -dark  urine -diarrhea -headache -loss of appetite -nausea or vomiting -temporary hair loss This list may not describe all possible side effects. Call your doctor for medical advice about side effects. You may report side effects to FDA at 1-800-FDA-1088. Where should I keep my medicine? Keep out of the reach of children. Store at room temperature between 15 and 30 degrees C (59 and 86 degrees F). Protect from light. Throw away any unused medicine after the expiration date. NOTE: This sheet is a summary. It may not cover all possible information. If you have questions about this medicine, talk to your doctor, pharmacist, or health care provider.  2018 Elsevier/Gold Standard (2007-11-05 15:56:47)  Hematuria, Adult Hematuria is blood in your urine. It can be caused by a bladder infection, kidney infection, prostate infection, kidney stone, or cancer of your urinary tract. Infections can usually be treated with medicine, and a kidney stone usually will pass through your urine. If neither of these is the cause of your hematuria, further workup to find out the reason may be needed. It is very important that you tell your health care provider about any blood you see in your urine, even if the blood stops without treatment or happens without causing pain. Blood in your urine that happens and then stops and then happens again can be a symptom of a very serious condition. Also, pain is not a symptom in the initial stages of many urinary cancers. Follow these instructions at home:  Drink lots of fluid, 3-4 quarts a day. If you have been diagnosed with an infection, cranberry juice is especially recommended, in addition to large amounts of water.  Avoid caffeine, tea, and carbonated beverages because they tend to irritate the bladder.  Avoid alcohol because it may irritate the prostate.  Take all medicines as directed by your health care provider.  If you were prescribed an antibiotic medicine, finish it  all even if you start to feel better.  If you have been diagnosed with a kidney stone, follow your health care provider's instructions regarding straining your urine to catch the stone.  Empty your bladder often. Avoid holding urine for long periods of time.  After a bowel movement, women should cleanse front to back. Use each tissue only once.  Empty your bladder before and after sexual intercourse if you are a female. Contact a health care provider if:  You develop back pain.  You have a fever.  You have a feeling of sickness in your stomach (nausea) or vomiting.  Your symptoms are not better in 3 days. Return sooner if you are getting worse. Get help right away if:  You develop severe vomiting and are unable to keep the medicine down.  You develop severe back or abdominal pain despite taking your medicines.  You begin passing a large amount of blood or clots in your urine.  You feel extremely weak or faint, or you pass out. This information is not intended to replace advice given to you by your health care provider.  Make sure you discuss any questions you have with your health care provider. Document Released: 04/16/2005 Document Revised: 09/22/2015 Document Reviewed: 12/15/2012 Elsevier Interactive Patient Education  2017 [employer].

## 2017-09-16 NOTE — Progress Notes (Addendum)
Subjective:     Patient ID: Tracy Burgess, female   DOB: 14-Jan-1966, 52 y.o.   MRN: [id number]  HPI   Blood pressure 130/74, pulse 93, temperature 99.2 F (37.3 C), weight 194 lb (88 kg), last menstrual period 09/09/2017, SpO2 98 %.  Patient is a 52 year old female in no acute distress with urinary frequency and urgency. She reports she noticed symptoms on this past Sunday with mild burning- she hydrated and noticed improvement.  She has seen some mild pink color to urine on toilet tissue that on tissue that started this morning. No blood in toilet.   Mildly febrile in this office today - she denies any fevers at home or chills.  Denies back pain or abdominal pain.   Denies any UTI in several years. - she reports no sex in 18 months. She reports her periods are coming more frequently than before at times and are irregular- as she was told by GYN she may be premenopausal.   Due for GYN yearly exam August per patient.  She denies any abdominal, back pain, or pelvic pain. Denies any rectal bleeding.   No new sexual partners or exposures.  Patient's last menstrual period was 09/09/2017.  Denies any injury or loss of bladder control.  Patient  denies any fever, body aches,chills, rash, chest pain, shortness of breath, nausea, vomiting, or diarrhea.      Review of Systems  Constitutional: Negative.   HENT: Negative.   Respiratory: Negative.   Cardiovascular: Negative.   Gastrointestinal: Negative.   Genitourinary: Positive for dysuria, hematuria and urgency. Negative for decreased urine volume, difficulty urinating, dyspareunia, enuresis, flank pain, frequency, genital sores, menstrual problem, pelvic pain, vaginal bleeding, vaginal discharge and vaginal pain.  Musculoskeletal: Negative.   Skin: Negative.   Allergic/Immunologic:        -- Sulfamethoxazole    --  Other reaction(s): Other (See Comments)          A            drop in blood sugar   Neurological: Negative.    Hematological: Negative.   Psychiatric/Behavioral: Negative.        Objective:   Physical Exam  Constitutional: She is oriented to person, place, and time. She appears well-developed and well-nourished. No distress.  Patient is alert and oriented and responsive to questions Engages in eye contact with provider. Speaks in full sentences without any pauses without any shortness of breath.  Patient moves on and off of exam table and in room without difficulty. Gait is normal in hall and in room. Patient is oriented to person place time and situation. Patient answers questions appropriately and engages in conversation.   HENT:  Head: Normocephalic and atraumatic.  Eyes: Pupils are equal, round, and reactive to light. EOM are normal.  Neck: Normal range of motion. Neck supple.  Cardiovascular: Normal rate, regular rhythm and intact distal pulses. Exam reveals no gallop and no friction rub.  No murmur heard. Pulmonary/Chest: Effort normal and breath sounds normal.  Abdominal: Soft. Normal appearance, normal aorta and bowel sounds are normal. She exhibits no distension and no mass. There is no tenderness. There is no rigidity, no rebound, no guarding and no CVA tenderness. No hernia.  Genitourinary:  Genitourinary Comments: No gynecology exams done in this office currently and no equipment available. Patient is aware she will have to see gynecology if needed for any pelvic/vaginal exam and will follow up with gynecology/obgyn as needed. Yearly gynecology pelvic exam recommended  and more often if symptoms do not fully resolve within three days or if worsens at anytime.  Patient verbalized understanding of instructions and denies any further questions at this time.    Musculoskeletal: Normal range of motion.  Neurological: She is alert and oriented to person, place, and time. No cranial nerve deficit.  Skin: Skin is warm and dry. Capillary refill takes less than 2 seconds. She is not diaphoretic.   Psychiatric: She has a normal mood and affect. Her behavior is normal. Judgment and thought content normal.  Vitals reviewed.      Assessment:    Dysuria - Plan: POCT Urinalysis Dipstick  Urinary tract infection with hematuria, site unspecified  Results for orders placed or performed in visit on 09/16/17 (from the past 24 hour(s))  POCT Urinalysis Dipstick     Status: Abnormal   Collection Time: 09/16/17  2:26 PM  Result Value Ref Range   Color, UA yellow    Clarity, UA cloudy    Glucose, UA Positive (A) Negative   Bilirubin, UA neg    Ketones, UA neg    Spec Grav, UA >=1.030 (A) 1.010 - 1.025   Blood, UA 3+    pH, UA 5.5 5.0 - 8.0   Protein, UA Positive (A) Negative   Urobilinogen, UA 0.2 0.2 or 1.0 E.U./dL   Nitrite, UA neg    Leukocytes, UA Trace (A) Negative   Appearance     Odor        Plan:     Meds ordered this encounter  Medications  . nitrofurantoin, macrocrystal-monohydrate, (MACROBID) 100 MG capsule    Sig: Take 1 capsule (100 mg total) by mouth 2 (two) times daily.    Dispense:  14 capsule    Refill:  0   Discussed treatment with Cipro and black box warning for Cipro with possible tendon rupture.  She prefers Macrobid and has taken before.  Advised no symptoms should worsen. Reviewed AVS. She is advised of urine POCT as above and need for follow up urine dipstick at end of treatment and sooner if needed or symptoms do not improve or worsen at anytime. Advised no gross hematuria or toilet water change in color should be seen-if more than spotting be seen or if spotting last longer than 2-3 days or worsens at anytime.  Discussed this clinic does not have the ability to send urine  culture- she will need follow up after completing antibiotic with her Gynecologist or primary care MD.  She is aware she needs repeat urine culture either way to rule out clearing of hematuria,bacteria and glucose.   If any symptoms worsen at anytime she will seek care at her  primary care MD office or GYN and she should have pelvic exam.   Advised patient call the office or your primary care doctor for an appointment if no improvement within 72 hours or if any symptoms change or worsen at any time  Advised ER or urgent Care if after hours or on weekend. Call 911 for emergency symptoms at any time.Patinet verbalized understanding of all instructions given/reviewed and treatment plan and has no further questions or concerns at this time.    Patient verbalized understanding of all instructions given and denies any further questions at this time.

## 2017-09-18 ENCOUNTER — Telehealth: Payer: Self-pay | Admitting: Emergency Medicine

## 2017-09-18 NOTE — Telephone Encounter (Signed)
Left message follow up call for visit with [facility] 

## 2017-10-29 DIAGNOSIS — E785 Hyperlipidemia, unspecified: Secondary | ICD-10-CM | POA: Diagnosis not present

## 2017-10-29 DIAGNOSIS — E1165 Type 2 diabetes mellitus with hyperglycemia: Secondary | ICD-10-CM | POA: Diagnosis not present

## 2017-10-29 DIAGNOSIS — E039 Hypothyroidism, unspecified: Secondary | ICD-10-CM | POA: Diagnosis not present

## 2017-10-29 DIAGNOSIS — I10 Essential (primary) hypertension: Secondary | ICD-10-CM | POA: Diagnosis not present

## 2017-11-05 DIAGNOSIS — E039 Hypothyroidism, unspecified: Secondary | ICD-10-CM | POA: Diagnosis not present

## 2017-11-05 DIAGNOSIS — E785 Hyperlipidemia, unspecified: Secondary | ICD-10-CM | POA: Diagnosis not present

## 2017-11-05 DIAGNOSIS — I10 Essential (primary) hypertension: Secondary | ICD-10-CM | POA: Diagnosis not present

## 2017-11-05 DIAGNOSIS — F411 Generalized anxiety disorder: Secondary | ICD-10-CM | POA: Diagnosis not present

## 2017-11-05 DIAGNOSIS — F329 Major depressive disorder, single episode, unspecified: Secondary | ICD-10-CM | POA: Diagnosis not present

## 2017-11-05 DIAGNOSIS — E1165 Type 2 diabetes mellitus with hyperglycemia: Secondary | ICD-10-CM | POA: Diagnosis not present

## 2018-03-04 DIAGNOSIS — E785 Hyperlipidemia, unspecified: Secondary | ICD-10-CM | POA: Diagnosis not present

## 2018-03-04 DIAGNOSIS — I10 Essential (primary) hypertension: Secondary | ICD-10-CM | POA: Diagnosis not present

## 2018-03-04 DIAGNOSIS — E1165 Type 2 diabetes mellitus with hyperglycemia: Secondary | ICD-10-CM | POA: Diagnosis not present

## 2018-03-04 DIAGNOSIS — E039 Hypothyroidism, unspecified: Secondary | ICD-10-CM | POA: Diagnosis not present

## 2018-03-11 DIAGNOSIS — E039 Hypothyroidism, unspecified: Secondary | ICD-10-CM | POA: Diagnosis not present

## 2018-03-11 DIAGNOSIS — E1165 Type 2 diabetes mellitus with hyperglycemia: Secondary | ICD-10-CM | POA: Diagnosis not present

## 2018-03-11 DIAGNOSIS — E785 Hyperlipidemia, unspecified: Secondary | ICD-10-CM | POA: Diagnosis not present

## 2018-03-11 DIAGNOSIS — I10 Essential (primary) hypertension: Secondary | ICD-10-CM | POA: Diagnosis not present

## 2018-03-11 DIAGNOSIS — M2550 Pain in unspecified joint: Secondary | ICD-10-CM | POA: Diagnosis not present

## 2018-03-11 DIAGNOSIS — F411 Generalized anxiety disorder: Secondary | ICD-10-CM | POA: Diagnosis not present

## 2018-03-11 DIAGNOSIS — F329 Major depressive disorder, single episode, unspecified: Secondary | ICD-10-CM | POA: Diagnosis not present

## 2018-06-17 DIAGNOSIS — E785 Hyperlipidemia, unspecified: Secondary | ICD-10-CM | POA: Diagnosis not present

## 2018-06-17 DIAGNOSIS — I10 Essential (primary) hypertension: Secondary | ICD-10-CM | POA: Diagnosis not present

## 2018-06-17 DIAGNOSIS — E039 Hypothyroidism, unspecified: Secondary | ICD-10-CM | POA: Diagnosis not present

## 2018-06-17 DIAGNOSIS — E1165 Type 2 diabetes mellitus with hyperglycemia: Secondary | ICD-10-CM | POA: Diagnosis not present

## 2018-06-24 DIAGNOSIS — I10 Essential (primary) hypertension: Secondary | ICD-10-CM | POA: Diagnosis not present

## 2018-06-24 DIAGNOSIS — E785 Hyperlipidemia, unspecified: Secondary | ICD-10-CM | POA: Diagnosis not present

## 2018-06-24 DIAGNOSIS — E559 Vitamin D deficiency, unspecified: Secondary | ICD-10-CM | POA: Diagnosis not present

## 2018-06-24 DIAGNOSIS — E039 Hypothyroidism, unspecified: Secondary | ICD-10-CM | POA: Diagnosis not present

## 2018-06-24 DIAGNOSIS — F411 Generalized anxiety disorder: Secondary | ICD-10-CM | POA: Diagnosis not present

## 2018-06-24 DIAGNOSIS — E1165 Type 2 diabetes mellitus with hyperglycemia: Secondary | ICD-10-CM | POA: Diagnosis not present

## 2018-06-24 DIAGNOSIS — E119 Type 2 diabetes mellitus without complications: Secondary | ICD-10-CM | POA: Diagnosis not present

## 2018-08-07 IMAGING — MG 2D DIGITAL DIAGNOSTIC UNILATERAL LEFT MAMMOGRAM WITH CAD AND ADJ
6 of 9 series · 6 of 21 positions shown · non-contrast
Comparison: Previous exam(s).

CLINICAL DATA: Patient complains of a palpable abnormality in the
left breast.

EXAM:
2D DIGITAL DIAGNOSTIC LEFT MAMMOGRAM WITH CAD AND ADJUNCT TOMO
ULTRASOUND LEFT BREAST

[L TAN]
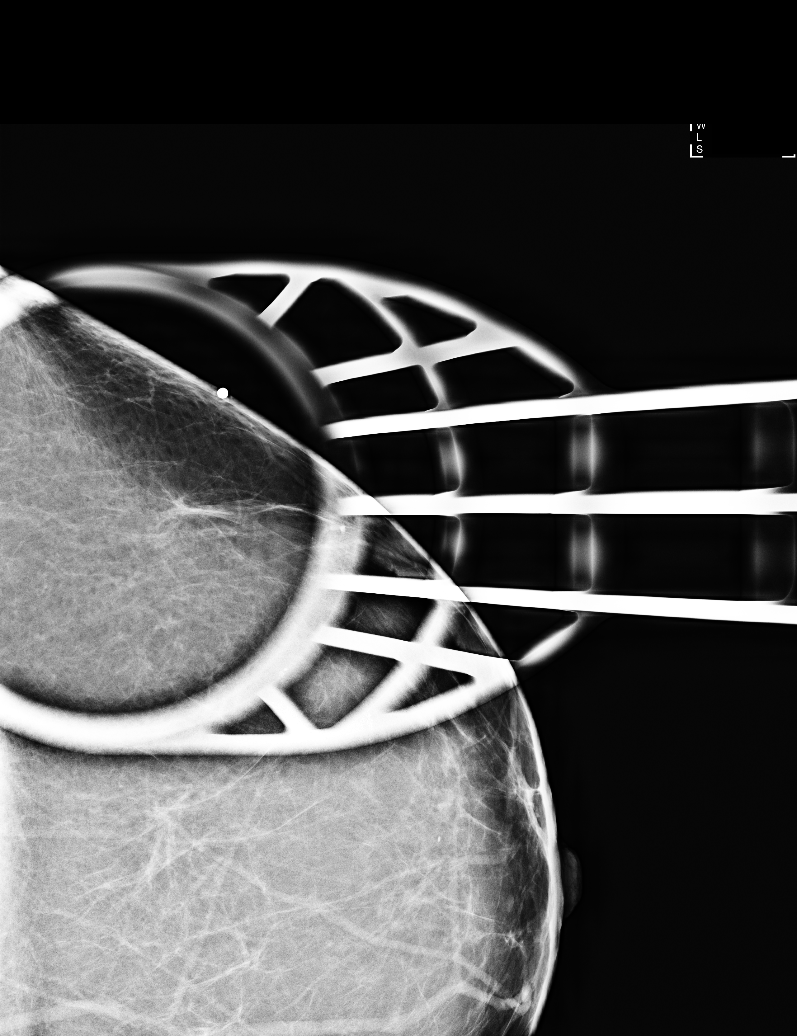

[L MLO]
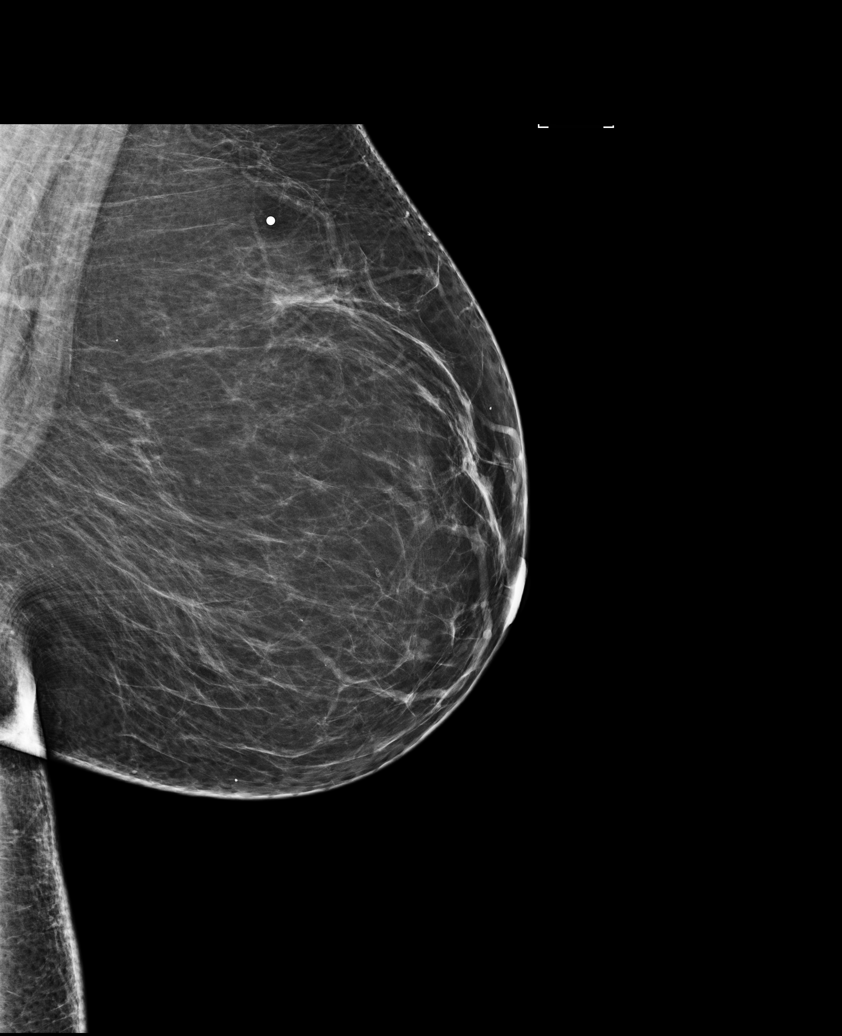

[L CC synth-2D]
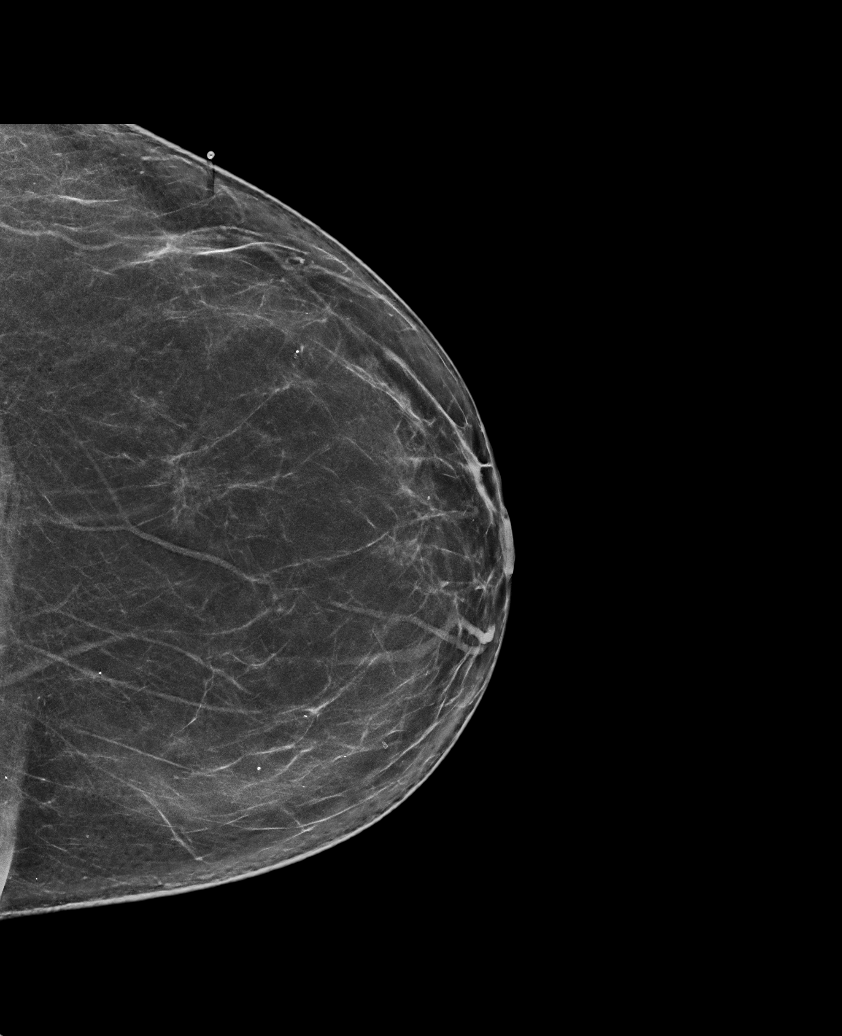

[L CC]
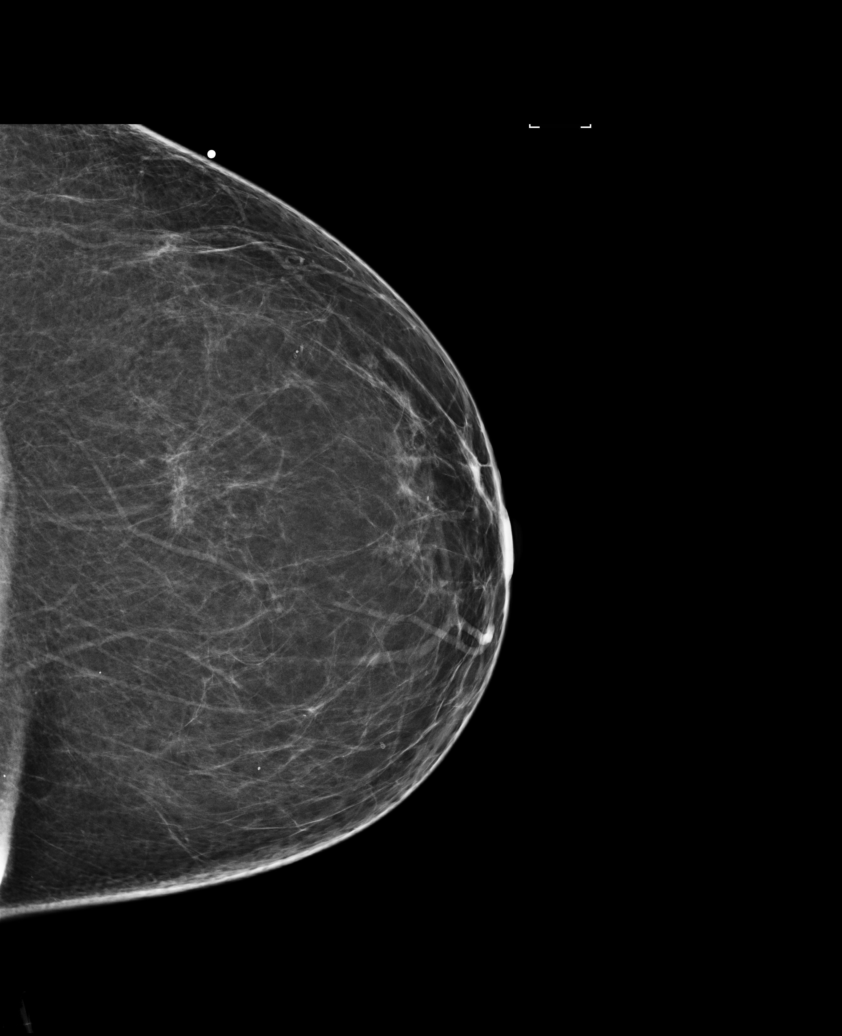

[L MLO synth-2D]
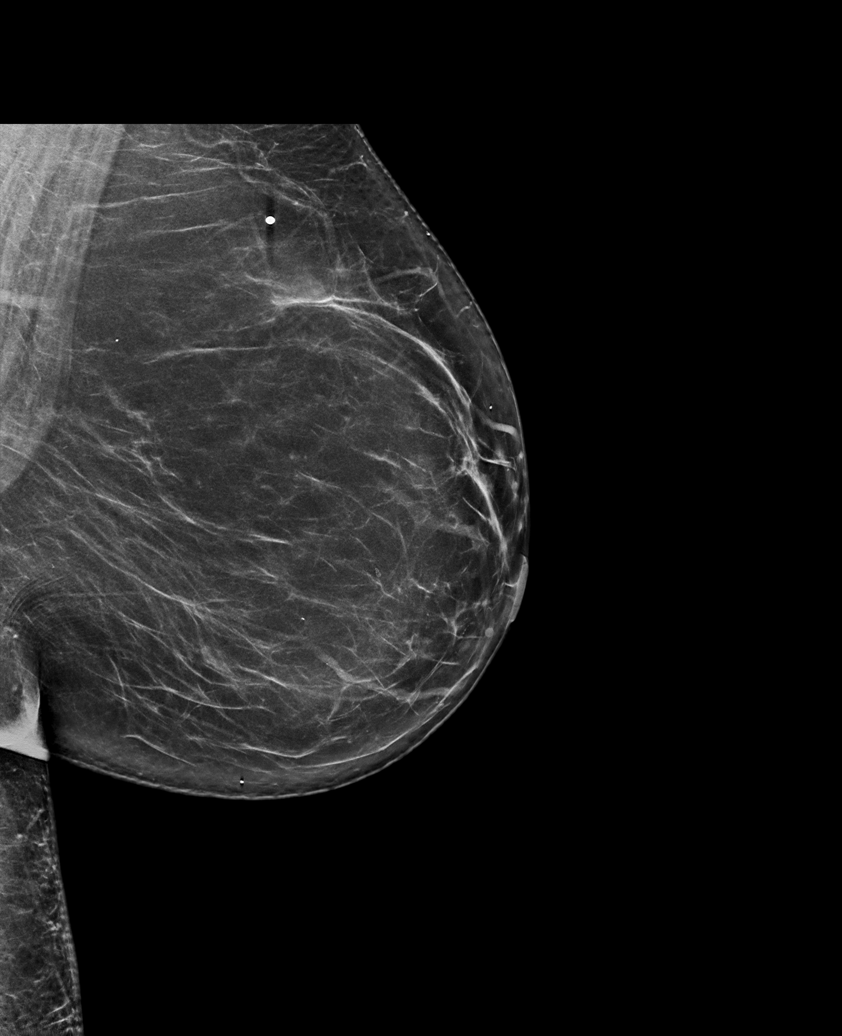

[L TAN synth-2D]
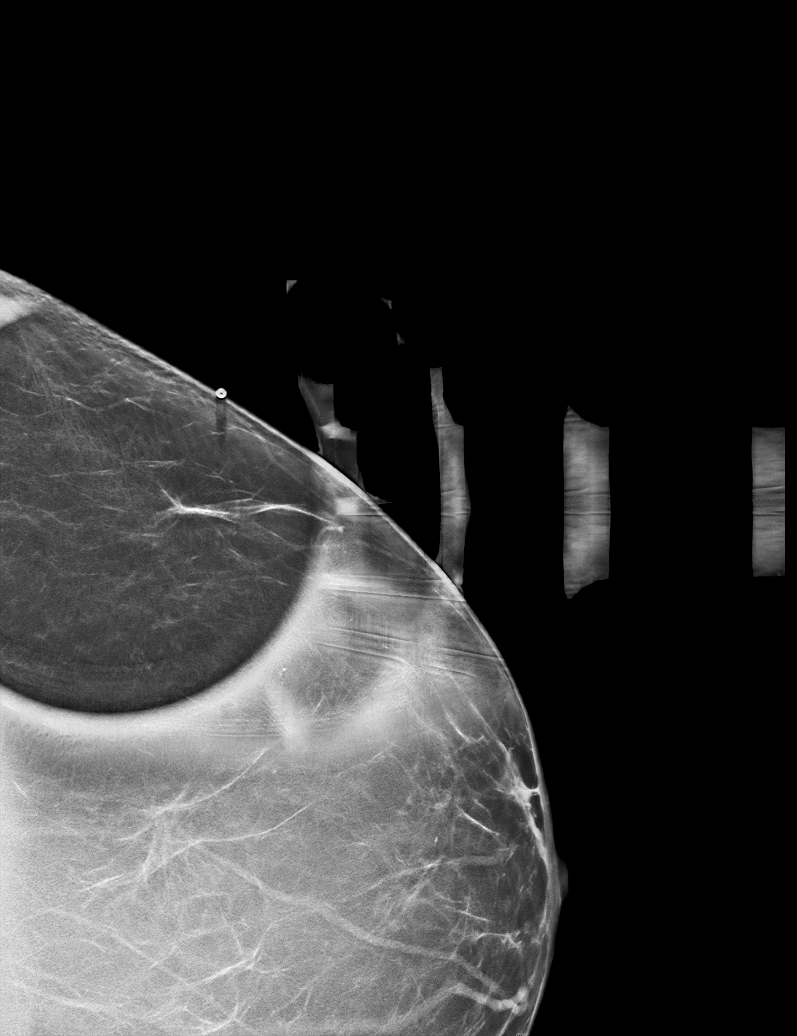

[6 of 21 positions shown; findings below may reference images not displayed]

ACR Breast Density Category b: There are scattered areas of
fibroglandular density.
FINDINGS: No suspicious mass, malignant type microcalcifications or distortion
detected in the left breast.

Mammographic images were processed with CAD.

On physical exam, I palpate an area of soft thickening in the 2
o'clock region of the left breast 7 cm from the nipple.

Targeted ultrasound is performed, showing normal tissue in the 2
o'clock region of the left breast. No solid or cystic mass, abnormal
shadowing or distortion visualized.
IMPRESSION: No evidence of malignancy in the left breast.

RECOMMENDATION:
Bilateral screening mammogram in Monday November, 2016 is recommended.

I have discussed the findings and recommendations with the patient.
Results were also provided in writing at the conclusion of the
visit. If applicable, a reminder letter will be sent to the patient
regarding the next appointment.

BI-RADS CATEGORY  1: Negative.

## 2018-10-14 DIAGNOSIS — E785 Hyperlipidemia, unspecified: Secondary | ICD-10-CM | POA: Diagnosis not present

## 2018-10-14 DIAGNOSIS — E559 Vitamin D deficiency, unspecified: Secondary | ICD-10-CM | POA: Diagnosis not present

## 2018-10-14 DIAGNOSIS — E1165 Type 2 diabetes mellitus with hyperglycemia: Secondary | ICD-10-CM | POA: Diagnosis not present

## 2018-10-14 DIAGNOSIS — E039 Hypothyroidism, unspecified: Secondary | ICD-10-CM | POA: Diagnosis not present

## 2018-10-16 DIAGNOSIS — E119 Type 2 diabetes mellitus without complications: Secondary | ICD-10-CM | POA: Diagnosis not present

## 2018-10-16 DIAGNOSIS — F411 Generalized anxiety disorder: Secondary | ICD-10-CM | POA: Diagnosis not present

## 2018-10-16 DIAGNOSIS — I10 Essential (primary) hypertension: Secondary | ICD-10-CM | POA: Diagnosis not present

## 2018-10-16 DIAGNOSIS — E039 Hypothyroidism, unspecified: Secondary | ICD-10-CM | POA: Diagnosis not present

## 2018-10-16 DIAGNOSIS — E785 Hyperlipidemia, unspecified: Secondary | ICD-10-CM | POA: Diagnosis not present

## 2019-02-05 DIAGNOSIS — E039 Hypothyroidism, unspecified: Secondary | ICD-10-CM | POA: Diagnosis not present

## 2019-02-05 DIAGNOSIS — E119 Type 2 diabetes mellitus without complications: Secondary | ICD-10-CM | POA: Diagnosis not present

## 2019-02-05 DIAGNOSIS — E785 Hyperlipidemia, unspecified: Secondary | ICD-10-CM | POA: Diagnosis not present

## 2019-02-10 DIAGNOSIS — I10 Essential (primary) hypertension: Secondary | ICD-10-CM | POA: Diagnosis not present

## 2019-02-10 DIAGNOSIS — E119 Type 2 diabetes mellitus without complications: Secondary | ICD-10-CM | POA: Diagnosis not present

## 2019-02-10 DIAGNOSIS — F411 Generalized anxiety disorder: Secondary | ICD-10-CM | POA: Diagnosis not present

## 2019-02-10 DIAGNOSIS — E039 Hypothyroidism, unspecified: Secondary | ICD-10-CM | POA: Diagnosis not present

## 2019-02-10 DIAGNOSIS — E559 Vitamin D deficiency, unspecified: Secondary | ICD-10-CM | POA: Diagnosis not present

## 2019-02-10 DIAGNOSIS — E785 Hyperlipidemia, unspecified: Secondary | ICD-10-CM | POA: Diagnosis not present

## 2019-05-27 DIAGNOSIS — Z1389 Encounter for screening for other disorder: Secondary | ICD-10-CM | POA: Diagnosis not present

## 2019-05-27 DIAGNOSIS — Z124 Encounter for screening for malignant neoplasm of cervix: Secondary | ICD-10-CM | POA: Diagnosis not present

## 2019-05-27 DIAGNOSIS — E669 Obesity, unspecified: Secondary | ICD-10-CM | POA: Diagnosis not present

## 2019-05-27 DIAGNOSIS — Z01419 Encounter for gynecological examination (general) (routine) without abnormal findings: Secondary | ICD-10-CM | POA: Diagnosis not present

## 2019-05-27 DIAGNOSIS — Z1231 Encounter for screening mammogram for malignant neoplasm of breast: Secondary | ICD-10-CM | POA: Diagnosis not present

## 2019-05-27 DIAGNOSIS — Z130 Encounter for screening for diseases of the blood and blood-forming organs and certain disorders involving the immune mechanism: Secondary | ICD-10-CM | POA: Diagnosis not present

## 2019-05-28 DIAGNOSIS — Z124 Encounter for screening for malignant neoplasm of cervix: Secondary | ICD-10-CM | POA: Diagnosis not present

## 2019-05-28 DIAGNOSIS — Z01419 Encounter for gynecological examination (general) (routine) without abnormal findings: Secondary | ICD-10-CM | POA: Diagnosis not present

## 2019-06-09 DIAGNOSIS — H5203 Hypermetropia, bilateral: Secondary | ICD-10-CM | POA: Diagnosis not present

## 2019-06-09 DIAGNOSIS — E119 Type 2 diabetes mellitus without complications: Secondary | ICD-10-CM | POA: Diagnosis not present

## 2019-06-09 DIAGNOSIS — H524 Presbyopia: Secondary | ICD-10-CM | POA: Diagnosis not present

## 2019-06-09 DIAGNOSIS — H52203 Unspecified astigmatism, bilateral: Secondary | ICD-10-CM | POA: Diagnosis not present

## 2019-06-16 DIAGNOSIS — E039 Hypothyroidism, unspecified: Secondary | ICD-10-CM | POA: Diagnosis not present

## 2019-06-16 DIAGNOSIS — E785 Hyperlipidemia, unspecified: Secondary | ICD-10-CM | POA: Diagnosis not present

## 2019-06-16 DIAGNOSIS — E119 Type 2 diabetes mellitus without complications: Secondary | ICD-10-CM | POA: Diagnosis not present

## 2019-06-16 DIAGNOSIS — E559 Vitamin D deficiency, unspecified: Secondary | ICD-10-CM | POA: Diagnosis not present

## 2019-06-23 ENCOUNTER — Other Ambulatory Visit: Payer: Self-pay | Admitting: Nurse Practitioner

## 2019-06-23 DIAGNOSIS — I10 Essential (primary) hypertension: Secondary | ICD-10-CM | POA: Diagnosis not present

## 2019-06-23 DIAGNOSIS — E039 Hypothyroidism, unspecified: Secondary | ICD-10-CM | POA: Diagnosis not present

## 2019-06-23 DIAGNOSIS — F419 Anxiety disorder, unspecified: Secondary | ICD-10-CM | POA: Diagnosis not present

## 2019-06-23 DIAGNOSIS — E119 Type 2 diabetes mellitus without complications: Secondary | ICD-10-CM | POA: Diagnosis not present

## 2019-06-23 DIAGNOSIS — E785 Hyperlipidemia, unspecified: Secondary | ICD-10-CM | POA: Diagnosis not present

## 2019-06-23 DIAGNOSIS — E559 Vitamin D deficiency, unspecified: Secondary | ICD-10-CM | POA: Diagnosis not present

## 2019-06-24 DIAGNOSIS — Z01419 Encounter for gynecological examination (general) (routine) without abnormal findings: Secondary | ICD-10-CM | POA: Diagnosis not present

## 2019-10-13 DIAGNOSIS — I10 Essential (primary) hypertension: Secondary | ICD-10-CM | POA: Diagnosis not present

## 2019-10-13 DIAGNOSIS — E559 Vitamin D deficiency, unspecified: Secondary | ICD-10-CM | POA: Diagnosis not present

## 2019-10-13 DIAGNOSIS — E785 Hyperlipidemia, unspecified: Secondary | ICD-10-CM | POA: Diagnosis not present

## 2019-10-13 DIAGNOSIS — E039 Hypothyroidism, unspecified: Secondary | ICD-10-CM | POA: Diagnosis not present

## 2019-10-13 DIAGNOSIS — E119 Type 2 diabetes mellitus without complications: Secondary | ICD-10-CM | POA: Diagnosis not present

## 2019-10-20 DIAGNOSIS — E785 Hyperlipidemia, unspecified: Secondary | ICD-10-CM | POA: Diagnosis not present

## 2019-10-20 DIAGNOSIS — E119 Type 2 diabetes mellitus without complications: Secondary | ICD-10-CM | POA: Diagnosis not present

## 2019-10-20 DIAGNOSIS — F411 Generalized anxiety disorder: Secondary | ICD-10-CM | POA: Diagnosis not present

## 2019-10-20 DIAGNOSIS — E039 Hypothyroidism, unspecified: Secondary | ICD-10-CM | POA: Diagnosis not present

## 2019-10-20 DIAGNOSIS — E559 Vitamin D deficiency, unspecified: Secondary | ICD-10-CM | POA: Diagnosis not present

## 2019-10-20 DIAGNOSIS — F329 Major depressive disorder, single episode, unspecified: Secondary | ICD-10-CM | POA: Diagnosis not present

## 2019-10-20 DIAGNOSIS — R5383 Other fatigue: Secondary | ICD-10-CM | POA: Diagnosis not present

## 2019-10-20 DIAGNOSIS — I10 Essential (primary) hypertension: Secondary | ICD-10-CM | POA: Diagnosis not present

## 2019-11-05 DIAGNOSIS — R011 Cardiac murmur, unspecified: Secondary | ICD-10-CM | POA: Diagnosis not present

## 2019-11-13 ENCOUNTER — Telehealth: Payer: Self-pay

## 2019-11-13 NOTE — Telephone Encounter (Signed)
NOTES ON FILE FROM  [facility] [phone], SENT REFERRAL TO SCHEDULING 

## 2019-12-23 ENCOUNTER — Ambulatory Visit: Payer: PRIVATE HEALTH INSURANCE | Admitting: Cardiovascular Disease

## 2019-12-24 ENCOUNTER — Telehealth: Payer: Self-pay

## 2019-12-24 ENCOUNTER — Other Ambulatory Visit: Payer: Self-pay

## 2019-12-24 DIAGNOSIS — Z1211 Encounter for screening for malignant neoplasm of colon: Secondary | ICD-10-CM

## 2019-12-24 MED ORDER — NA SULFATE-K SULFATE-MG SULF 17.5-3.13-1.6 GM/177ML PO SOLN: ORAL | 0 refills | Status: DC

## 2019-12-24 NOTE — Telephone Encounter (Signed)
Adin called from endoscopy unit stating that she had spoken to Dr. Bonna Gains and they agreed on 01/05/2020 at [facility] to have her screening colonoscopy. I told her that I would go ahead and schedule it for her. I told her that I would send her instructins to MyChart and her prescriptiion to her [place]. Patient agreed.

## 2019-12-25 DIAGNOSIS — Z20822 Contact with and (suspected) exposure to covid-19: Secondary | ICD-10-CM | POA: Diagnosis not present

## 2020-01-01 ENCOUNTER — Other Ambulatory Visit: Payer: Self-pay

## 2020-01-01 ENCOUNTER — Other Ambulatory Visit
Admission: RE | Admit: 2020-01-01 | Discharge: 2020-01-01 | Disposition: A | Discharge status: Home / Self Care | Payer: 59 | Source: Ambulatory Visit | Attending: Gastroenterology | Admitting: Gastroenterology

## 2020-01-01 DIAGNOSIS — Z01812 Encounter for preprocedural laboratory examination: Secondary | ICD-10-CM | POA: Diagnosis not present

## 2020-01-01 DIAGNOSIS — Z20822 Contact with and (suspected) exposure to covid-19: Secondary | ICD-10-CM | POA: Insufficient documentation

## 2020-01-01 LAB — SARS CORONAVIRUS 2 (TAT 6-24 HRS): SARS Coronavirus 2: NEGATIVE

## 2020-01-05 ENCOUNTER — Encounter: Payer: Self-pay | Admitting: Gastroenterology

## 2020-01-05 ENCOUNTER — Encounter
Admission: RE | Disposition: A | Discharge status: Home / Self Care | Payer: Self-pay | Source: Ambulatory Visit | Attending: Gastroenterology

## 2020-01-05 ENCOUNTER — Ambulatory Visit: Payer: 59 | Admitting: Anesthesiology

## 2020-01-05 ENCOUNTER — Ambulatory Visit
Admission: RE | Admit: 2020-01-05 | Discharge: 2020-01-05 | Disposition: A | Discharge status: Home / Self Care | Payer: 59 | Source: Ambulatory Visit | Attending: Gastroenterology | Admitting: Gastroenterology

## 2020-01-05 ENCOUNTER — Other Ambulatory Visit: Payer: Self-pay

## 2020-01-05 DIAGNOSIS — E119 Type 2 diabetes mellitus without complications: Secondary | ICD-10-CM | POA: Diagnosis not present

## 2020-01-05 DIAGNOSIS — Z1211 Encounter for screening for malignant neoplasm of colon: Secondary | ICD-10-CM

## 2020-01-05 DIAGNOSIS — Z7984 Long term (current) use of oral hypoglycemic drugs: Secondary | ICD-10-CM | POA: Insufficient documentation

## 2020-01-05 DIAGNOSIS — D120 Benign neoplasm of cecum: Secondary | ICD-10-CM | POA: Insufficient documentation

## 2020-01-05 DIAGNOSIS — Z79899 Other long term (current) drug therapy: Secondary | ICD-10-CM | POA: Insufficient documentation

## 2020-01-05 DIAGNOSIS — Z882 Allergy status to sulfonamides status: Secondary | ICD-10-CM | POA: Diagnosis not present

## 2020-01-05 DIAGNOSIS — I10 Essential (primary) hypertension: Secondary | ICD-10-CM | POA: Diagnosis not present

## 2020-01-05 DIAGNOSIS — E785 Hyperlipidemia, unspecified: Secondary | ICD-10-CM | POA: Diagnosis not present

## 2020-01-05 DIAGNOSIS — F418 Other specified anxiety disorders: Secondary | ICD-10-CM | POA: Diagnosis not present

## 2020-01-05 DIAGNOSIS — K635 Polyp of colon: Secondary | ICD-10-CM

## 2020-01-05 HISTORY — PX: COLONOSCOPY WITH PROPOFOL: SHX5780

## 2020-01-05 LAB — GLUCOSE, CAPILLARY: Glucose-Capillary: 170 mg/dL — ABNORMAL HIGH (ref 70–99)

## 2020-01-05 SURGERY — COLONOSCOPY WITH PROPOFOL
Anesthesia: General

## 2020-01-05 MED ORDER — EPHEDRINE 5 MG/ML INJ
INTRAVENOUS | Status: AC
  Filled 2020-01-05: qty 4

## 2020-01-05 MED ORDER — MIDAZOLAM HCL 2 MG/2ML IJ SOLN
INTRAMUSCULAR | Status: AC
  Filled 2020-01-05: qty 2

## 2020-01-05 MED ORDER — LIDOCAINE HCL (CARDIAC) PF 100 MG/5ML IV SOSY
PREFILLED_SYRINGE | INTRAVENOUS | Status: DC | PRN
  Administered 2020-01-05: 80 mg via INTRAVENOUS
  Administered 2020-01-05: 20 mg via INTRAVENOUS

## 2020-01-05 MED ORDER — PROPOFOL 10 MG/ML IV BOLUS
INTRAVENOUS | Status: AC
  Filled 2020-01-05: qty 20

## 2020-01-05 MED ORDER — PROPOFOL 10 MG/ML IV BOLUS
INTRAVENOUS | Status: DC | PRN
  Administered 2020-01-05: 30 mg via INTRAVENOUS
  Administered 2020-01-05: 10 mg via INTRAVENOUS
  Administered 2020-01-05: 50 mg via INTRAVENOUS
  Administered 2020-01-05: 10 mg via INTRAVENOUS

## 2020-01-05 MED ORDER — PROPOFOL 500 MG/50ML IV EMUL
INTRAVENOUS | Status: DC | PRN
  Administered 2020-01-05: 155 ug/kg/min via INTRAVENOUS

## 2020-01-05 MED ORDER — EPHEDRINE SULFATE 50 MG/ML IJ SOLN
INTRAMUSCULAR | Status: DC | PRN
  Administered 2020-01-05 (×3): 5 mg via INTRAVENOUS

## 2020-01-05 MED ORDER — SODIUM CHLORIDE 0.9 % IV SOLN
INTRAVENOUS | Status: DC
  Administered 2020-01-05: 1000 mL via INTRAVENOUS

## 2020-01-05 MED ORDER — ESMOLOL HCL 100 MG/10ML IV SOLN
INTRAVENOUS | Status: AC
  Filled 2020-01-05: qty 10

## 2020-01-05 MED ORDER — EPHEDRINE 5 MG/ML INJ
INTRAVENOUS | Status: AC
  Filled 2020-01-05: qty 10

## 2020-01-05 MED ORDER — LIDOCAINE HCL (PF) 2 % IJ SOLN
INTRAMUSCULAR | Status: AC
  Filled 2020-01-05: qty 15

## 2020-01-05 NOTE — Anesthesia Postprocedure Evaluation (Signed)
Anesthesia Post Note  Patient: Tracy Burgess  Procedure(s) Performed: COLONOSCOPY WITH PROPOFOL (N/A )  Patient location during evaluation: Endoscopy Anesthesia Type: General Level of consciousness: awake and alert Pain management: pain level controlled Vital Signs Assessment: post-procedure vital signs reviewed and stable Respiratory status: spontaneous breathing and respiratory function stable Cardiovascular status: stable Anesthetic complications: no   No complications documented.   Last Vitals:  Vitals:   01/05/20 0949 01/05/20 0959  BP: 108/64 95/69  Pulse:    Resp:    Temp:    SpO2: 97%     Last Pain:  Vitals:   01/05/20 0959  TempSrc:   PainSc: 0-No pain                 Alizzon Dioguardi K

## 2020-01-05 NOTE — Anesthesia Procedure Notes (Signed)
Procedure Name: General with mask airway Performed by: Fletcher-Harrison, Tanesha Arambula, CRNA Pre-anesthesia Checklist: Patient identified, Emergency Drugs available, Suction available and Patient being monitored Patient Re-evaluated:Patient Re-evaluated prior to induction Oxygen Delivery Method: Simple face mask Induction Type: IV induction Placement Confirmation: positive ETCO2 and CO2 detector Dental Injury: Teeth and Oropharynx as per pre-operative assessment        

## 2020-01-05 NOTE — Anesthesia Preprocedure Evaluation (Signed)
Anesthesia Evaluation  Patient identified by MRN, date of birth, ID band Patient awake    Reviewed: Allergy & Precautions, NPO status , Patient's Chart, lab work & pertinent test results  History of Anesthesia Complications Negative for: history of anesthetic complications  Airway Mallampati: II       Dental   Pulmonary neg sleep apnea, neg COPD, Not current smoker,           Cardiovascular hypertension, Pt. on medications (-) Past MI and (-) CHF (-) dysrhythmias (-) Valvular Problems/Murmurs     Neuro/Psych neg Seizures Anxiety Depression    GI/Hepatic Neg liver ROS, neg GERD  ,  Endo/Other  diabetes, Type 2, Oral Hypoglycemic Agents  Renal/GU negative Renal ROS     Musculoskeletal   Abdominal   Peds  Hematology   Anesthesia Other Findings   Reproductive/Obstetrics                             Anesthesia Physical Anesthesia Plan  ASA: III  Anesthesia Plan: General   Post-op Pain Management:    Induction: Intravenous  PONV Risk Score and Plan: Propofol infusion, TIVA and Treatment may vary due to age or medical condition  Airway Management Planned: Nasal Cannula  Additional Equipment:   Intra-op Plan:   Post-operative Plan:   Informed Consent: I have reviewed the patients History and Physical, chart, labs and discussed the procedure including the risks, benefits and alternatives for the proposed anesthesia with the patient or authorized representative who has indicated his/her understanding and acceptance.       Plan Discussed with:   Anesthesia Plan Comments:         Anesthesia Quick Evaluation

## 2020-01-05 NOTE — H&P (Addendum)
Tracy Antigua, MD [address] [place], [place], [place], [address] [address], [place], [place], [place], [address] Phone: [phone]  Fax: [phone]  Primary Care Physician:  Eloise Levels, NP   Pre-Procedure History & Physical: HPI:  Tracy Burgess is a 54 y.o. female is here for a colonoscopy.   Past Medical History:  Diagnosis Date  . Allergy   . Anxiety   . Depression   . Diabetes mellitus without complication ([place])   . Hyperlipidemia   . Hypertension     Past Surgical History:  Procedure Laterality Date  . CARPAL TUNNEL RELEASE Bilateral [id number]  . CESAREAN SECTION  [id number]  . TONSILLECTOMY  1992  . tubes in ears  1971    Prior to Admission medications   Medication Sig Start Date End Date Taking? Authorizing Provider  dapagliflozin propanediol (FARXIGA) 10 MG TABS tablet Take 10 mg by mouth daily.   Yes [provider]  glipiZIDE (GLUCOTROL) 5 MG tablet Take 5 mg by mouth daily before breakfast.  08/17/15  Yes [provider]  ibuprofen (ADVIL,MOTRIN) 200 MG tablet Take 200 mg by mouth every 6 (six) hours as needed.   Yes [provider]  lisinopril-hydrochlorothiazide (PRINZIDE,ZESTORETIC) 20-12.5 MG tablet Take 1 tablet by mouth daily.  08/17/15  Yes [provider]  loratadine (CLARITIN) 10 MG tablet Take 10 mg by mouth daily.   Yes [provider]  Na Sulfate-K Sulfate-Mg Sulf 17.5-3.13-1.6 GM/177ML SOLN At 5 PM the day before procedure take 1 bottle and 5 hours before procedure take 1 bottle. 12/24/19  Yes Tracy Burgess B, MD  rosuvastatin (CRESTOR) 40 MG tablet Take 40 mg by mouth daily.   Yes [provider]  Semaglutide (RYBELSUS) 7 MG TABS Take by mouth.   Yes [provider]  venlafaxine XR (EFFEXOR-XR) 150 MG 24 hr capsule Take 150 mg by mouth daily with breakfast.  08/31/15  Yes [provider]  fenofibrate 54 MG tablet Take 54 mg by mouth daily.  Patient not  taking: Reported on 01/05/2020 08/17/15   [provider]  fluticasone (FLONASE) 50 MCG/ACT nasal spray Place 2 sprays into both nostrils daily. Patient not taking: Reported on 01/05/2020 09/05/15   Versie Starks, PA-C  metFORMIN (GLUCOPHAGE-XR) 500 MG 24 hr tablet Take 1,000 mg by mouth daily with breakfast.  Patient not taking: Reported on 01/05/2020 03/30/16   [provider]  nitrofurantoin, macrocrystal-monohydrate, (MACROBID) 100 MG capsule Take 1 capsule (100 mg total) by mouth 2 (two) times daily. Patient not taking: Reported on 01/05/2020 09/16/17   Flinchum, Kelby Aline, FNP  norethindrone (MICRONOR,CAMILA,ERRIN) 0.35 MG tablet Take 1 tablet by mouth daily.  Patient not taking: Reported on 01/05/2020 08/17/15   [provider]  simvastatin (ZOCOR) 80 MG tablet Take 80 mg by mouth daily at 6 PM.  Patient not taking: Reported on 01/05/2020 08/17/15   [provider]    Allergies as of 12/24/2019 - Review Complete 09/16/2017  Allergen Reaction Noted  . Sulfamethoxazole  09/05/2015    Family History  Problem Relation Age of Onset  . Breast cancer Paternal Grandmother 76  . Diabetes Father     Social History   Socioeconomic History  . Marital status: Single    Spouse name: Not on file  . Number of children: Not on file  . Years of education: Not on file  . Highest education level: Not on file  Occupational History  . Not on file  Tobacco Use  .  Smoking status: Never Smoker  . Smokeless tobacco: Never Used  Vaping Use  . Vaping Use: Never used  Substance and Sexual Activity  . Alcohol use: Yes    Alcohol/week: 0.0 standard drinks  . Drug use: No  . Sexual activity: Not on file  Other Topics Concern  . Not on file  Social History Narrative  . Not on file   Social Determinants of Health   Financial Resource Strain:   . Difficulty of Paying Living Expenses: Not on file  Food Insecurity:   . Worried About Charity fundraiser in the Last Year:  Not on file  . Ran Out of Food in the Last Year: Not on file  Transportation Needs:   . Lack of Transportation (Medical): Not on file  . Lack of Transportation (Non-Medical): Not on file  Physical Activity:   . Days of Exercise per Week: Not on file  . Minutes of Exercise per Session: Not on file  Stress:   . Feeling of Stress : Not on file  Social Connections:   . Frequency of Communication with Friends and Family: Not on file  . Frequency of Social Gatherings with Friends and Family: Not on file  . Attends Religious Services: Not on file  . Active Member of Clubs or Organizations: Not on file  . Attends Archivist Meetings: Not on file  . Marital Status: Not on file  Intimate Partner Violence:   . Fear of Current or Ex-Partner: Not on file  . Emotionally Abused: Not on file  . Physically Abused: Not on file  . Sexually Abused: Not on file    Review of Systems: See HPI, otherwise negative ROS  Physical Exam: BP 118/79   Pulse 75   Temp (!) 97.5 F (36.4 C) (Temporal)   Resp 17   Ht 5\' 4"  (1.626 m)   Wt 82.1 kg   SpO2 96%   BMI 31.07 kg/m  General:   Alert,  pleasant and cooperative in NAD Head:  Normocephalic and atraumatic. Neck:  Supple; no masses or thyromegaly. Lungs:  normal respiratory effort.    Heart:  No edema. Skin: Warm, no rashes Abdomen:  Soft, nontender and nondistended. Normal bowel sounds, without guarding, and without rebound.   Neurologic:  Alert and  oriented x4;  grossly normal neurologically.  Impression/Plan: Tracy Burgess is here for a colonoscopy to be performed for average risk screening.  Risks, benefits, limitations, and alternatives regarding  colonoscopy have been reviewed with the patient.  Questions have been answered.  All parties agreeable.   Virgel Manifold, MD  01/05/2020, 8:38 AM

## 2020-01-05 NOTE — Op Note (Addendum)
[facility] Patient Name: Tracy Burgess Procedure Date: 01/05/2020 7:49 AM MRN: [id number] Account #: [id number] Date of Birth: November 05, 1965 Admit Type: Outpatient Age: 54 Room: [facility] ENDO ROOM 1 Gender: Female Note Status: Finalized Procedure:             Colonoscopy Indications:           Screening for colorectal malignant neoplasm Providers:             Joleigh Mineau B. Bonna Gains MD, MD Referring MD:          Evern Bio PA Medicines:             Monitored Anesthesia Care Complications:         No immediate complications. Procedure:             Pre-Anesthesia Assessment:                        - ASA Grade Assessment: III - A patient with severe                         systemic disease.                        - Prior to the procedure, a History and Physical was                         performed, and patient medications, allergies and                         sensitivities were reviewed. The patient's tolerance                         of previous anesthesia was reviewed.                        - The risks and benefits of the procedure and the                         sedation options and risks were discussed with the                         patient. All questions were answered and informed                         consent was obtained.                        - Patient identification and proposed procedure were                         verified prior to the procedure by the physician, the                         nurse, the anesthesiologist, the anesthetist and the                         technician. The procedure was verified in the                         procedure room.  After obtaining informed consent, the colonoscope was                         passed under direct vision. Throughout the procedure,                         the patient's blood pressure, pulse, and oxygen                         saturations were monitored  continuously. The                         Colonoscope was introduced through the anus and                         advanced to the the cecum, identified by appendiceal                         orifice and ileocecal valve. The colonoscopy was                         performed with ease. The patient tolerated the                         procedure well. The quality of the bowel preparation                         was good. Findings:      The perianal and digital rectal examinations were normal.      A 12 mm polyp was found in the cecum. The polyp was flat. The polyp was       removed with a piecemeal technique using a cold snare. Resection and       retrieval were complete. To prevent bleeding after the polypectomy, one       hemostatic clip was successfully placed. There was no bleeding at the       end of the procedure.      The exam was otherwise without abnormality.      The rectum, sigmoid colon, descending colon, transverse colon and       ascending colon appeared normal.      The retroflexed view of the distal rectum and anal verge was normal and       showed no anal or rectal abnormalities. Impression:            - One 12 mm polyp in the cecum, removed piecemeal                         using a cold snare. Resected and retrieved. Clip was                         placed.                        - The examination was otherwise normal.                        - The rectum, sigmoid colon, descending colon,  transverse colon and ascending colon are normal.                        - The distal rectum and anal verge are normal on                         retroflexion view. Recommendation:        - Await pathology results.                        - Discharge patient to home (with escort).                        - Advance diet as tolerated.                        - Continue present medications.                        - Repeat colonoscopy date to be determined after                          pending pathology results are reviewed.                        - The findings and recommendations were discussed with                         the patient.                        - The findings and recommendations were discussed with                         the patient's family.                        - Return to primary care physician as previously                         scheduled. Procedure Code(s):     --- Professional ---                        [phone], Colonoscopy, flexible; with removal of                         tumor(s), polyp(s), or other lesion(s) by snare                         technique Diagnosis Code(s):     --- Professional ---                        Z12.11, Encounter for screening for malignant neoplasm                         of colon                        K63.5, Polyp of colon CPT copyright 2019 American Medical Association. All rights reserved. The codes documented in this report are preliminary and upon  coder review may  be revised to meet current compliance requirements.  Vonda Antigua, MD Margretta Sidle B. Bonna Gains MD, MD 01/05/2020 9:33:03 AM This report has been signed electronically. Number of Addenda: 0 Note Initiated On: 01/05/2020 7:49 AM Scope Withdrawal Time: 0 hours 33 minutes 11 seconds  Total Procedure Duration: 0 hours 38 minutes 46 seconds  Estimated Blood Loss:  Estimated blood loss: none.      [facility]

## 2020-01-05 NOTE — Transfer of Care (Signed)
Immediate Anesthesia Transfer of Care Note  Patient: Tracy Burgess  Procedure(s) Performed: COLONOSCOPY WITH PROPOFOL (N/A )  Patient Location: Endoscopy Unit  Anesthesia Type:General  Level of Consciousness: awake, drowsy and patient cooperative  Airway & Oxygen Therapy: Patient Spontanous Breathing and Patient connected to face mask oxygen  Post-op Assessment: Report given to RN and Post -op Vital signs reviewed and stable  Post vital signs: Reviewed and stable  Last Vitals:  Vitals Value Taken Time  BP 96/78 01/05/20 0929  Temp 36.3 C 01/05/20 0929  Pulse 81 01/05/20 0932  Resp 19 01/05/20 0932  SpO2 96 % 01/05/20 0932  Vitals shown include unvalidated device data.  Last Pain:  Vitals:   01/05/20 0929  TempSrc: Temporal  PainSc: Asleep         Complications: No complications documented.

## 2020-01-06 ENCOUNTER — Encounter: Payer: Self-pay | Admitting: Gastroenterology

## 2020-01-06 LAB — SURGICAL PATHOLOGY

## 2020-02-02 ENCOUNTER — Other Ambulatory Visit: Payer: Self-pay

## 2020-02-02 ENCOUNTER — Encounter: Payer: Self-pay | Admitting: Cardiovascular Disease

## 2020-02-02 ENCOUNTER — Ambulatory Visit (INDEPENDENT_AMBULATORY_CARE_PROVIDER_SITE_OTHER): Payer: 59 | Admitting: Cardiovascular Disease

## 2020-02-02 VITALS — BP 132/64 | HR 65 | Ht 64.0 in | Wt 180.8 lb

## 2020-02-02 DIAGNOSIS — I517 Cardiomegaly: Secondary | ICD-10-CM | POA: Diagnosis not present

## 2020-02-02 DIAGNOSIS — E785 Hyperlipidemia, unspecified: Secondary | ICD-10-CM | POA: Insufficient documentation

## 2020-02-02 DIAGNOSIS — E782 Mixed hyperlipidemia: Secondary | ICD-10-CM | POA: Diagnosis not present

## 2020-02-02 DIAGNOSIS — I10 Essential (primary) hypertension: Secondary | ICD-10-CM

## 2020-02-02 NOTE — Assessment & Plan Note (Signed)
Recent 2D echocardiogram performed at [facility] cardiology 11/10/2019 showed severe left atrial and left ventricular enlargement with normal LV function.  She had mild mitral regurgitation.  I am going to repeat a 2D echocardiogram.  She is totally asymptomatic.

## 2020-02-02 NOTE — Patient Instructions (Signed)
  Testing/Procedures:  Your physician has requested that you have an echocardiogram. Echocardiography is a painless test that uses sound waves to create images of your heart. It provides your doctor with information about the size and shape of your heart and how well your heart's chambers and valves are working. This procedure takes approximately one hour. There are no restrictions for this procedure. [address]   Follow-Up: At [facility], you and your health needs are our priority.  As part of our continuing mission to provide you with exceptional heart care, we have created designated Provider Care Teams.  These Care Teams include your primary Cardiologist (physician) and Advanced Practice Providers (APPs -  Physician Assistants and Nurse Practitioners) who all work together to provide you with the care you need, when you need it.  We recommend signing up for the patient portal called "MyChart".  Sign up information is provided on this After Visit Summary.  MyChart is used to connect with patients for Virtual Visits (Telemedicine).  Patients are able to view lab/test results, encounter notes, upcoming appointments, etc.  Non-urgent messages can be sent to your provider as well.   To learn more about what you can do with MyChart, go to [email or web address].    Your next appointment:    AS NEEDED 

## 2020-02-02 NOTE — Assessment & Plan Note (Signed)
History of essential hypertension with blood pressure measured today 132/64.  She is on lisinopril and hydrochlorothiazide.

## 2020-02-02 NOTE — Progress Notes (Signed)
02/02/2020 Garth Schlatter   24-Nov-1965  [id number]  Primary Physician Eloise Levels, NP Primary Cardiologist: Lorretta Harp MD Lupe Carney, [place]  HPI:  Tracy Burgess is a 54 y.o. moderately overweight divorced Caucasian female mother of 2 daughters (35 years old, one 58 year old years old), with no grandchildren who was referred by her PCP, Evern Bio, NP, for cardiovascular valuation because of abnormal 2D echocardiogram.  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  She recently completed school for her BSN.  She never smoked.  She does have a history of hypertension, diabetes and hyperlipidemia.  There is no family history for heart disease.  She is never had a heart attack or stroke.  She denies chest pain or shortness of breath.  She is fairly active but does not exercise.  She had a 2D echocardiogram performed by Dr. Humphrey Rolls at [facility] 11/10/2019 that showed severe left atrial and ventricular enlargement with normal LV function and grade 1 diastolic dysfunction.   Current Meds  Medication Sig  . Blood Glucose Monitoring Suppl (FREESTYLE LITE) DEVI freestyle lite meter  . Cholecalciferol (VITAMIN D) 50 MCG (2000 UT) CAPS   . dapagliflozin propanediol (FARXIGA) 10 MG TABS tablet Take 10 mg by mouth daily.  Marland Kitchen glipiZIDE (GLUCOTROL XL) 10 MG 24 hr tablet glipizide ER 10 mg tablet, extended release 24 hr  . glucose blood test strip FreeStyle Lite Strips  . ibuprofen (ADVIL,MOTRIN) 200 MG tablet Take 200 mg by mouth every 6 (six) hours as needed.  Marland Kitchen icosapent Ethyl (VASCEPA) 1 g capsule Take 2 g by mouth in the morning and at bedtime.  . Lancets (FREESTYLE) lancets freestyle lancets  . lisinopril-hydrochlorothiazide (PRINZIDE,ZESTORETIC) 20-12.5 MG tablet Take 1 tablet by mouth daily.   Marland Kitchen loratadine (CLARITIN) 10 MG tablet Take 10 mg by mouth daily.  . Na Sulfate-K Sulfate-Mg Sulf 17.5-3.13-1.6 GM/177ML SOLN At 5 PM the day  before procedure take 1 bottle and 5 hours before procedure take 1 bottle.  . rosuvastatin (CRESTOR) 40 MG tablet Take 40 mg by mouth daily.  . Semaglutide (RYBELSUS) 7 MG TABS Take by mouth.  . venlafaxine (EFFEXOR) 37.5 MG tablet   . venlafaxine XR (EFFEXOR-XR) 150 MG 24 hr capsule Take 150 mg by mouth daily with breakfast.      Allergies  Allergen Reactions  . Sulfamethoxazole     Other reaction(s): Other (See Comments) A drop in blood sugar    Social History   Socioeconomic History  . Marital status: Single    Spouse name: Not on file  . Number of children: Not on file  . Years of education: Not on file  . Highest education level: Not on file  Occupational History  . Not on file  Tobacco Use  . Smoking status: Never Smoker  . Smokeless tobacco: Never Used  Vaping Use  . Vaping Use: Never used  Substance and Sexual Activity  . Alcohol use: Yes    Alcohol/week: 0.0 standard drinks  . Drug use: No  . Sexual activity: Not on file  Other Topics Concern  . Not on file  Social History Narrative  . Not on file   Social Determinants of Health   Financial Resource Strain:   . Difficulty of Paying Living Expenses: Not on file  Food Insecurity:   . Worried About Charity fundraiser in the Last Year: Not on file  . Ran Out of  Food in the Last Year: Not on file  Transportation Needs:   . Lack of Transportation (Medical): Not on file  . Lack of Transportation (Non-Medical): Not on file  Physical Activity:   . Days of Exercise per Week: Not on file  . Minutes of Exercise per Session: Not on file  Stress:   . Feeling of Stress : Not on file  Social Connections:   . Frequency of Communication with Friends and Family: Not on file  . Frequency of Social Gatherings with Friends and Family: Not on file  . Attends Religious Services: Not on file  . Active Member of Clubs or Organizations: Not on file  . Attends Archivist Meetings: Not on file  . Marital Status: Not  on file  Intimate Partner Violence:   . Fear of Current or Ex-Partner: Not on file  . Emotionally Abused: Not on file  . Physically Abused: Not on file  . Sexually Abused: Not on file     Review of Systems: General: negative for chills, fever, night sweats or weight changes.  Cardiovascular: negative for chest pain, dyspnea on exertion, edema, orthopnea, palpitations, paroxysmal nocturnal dyspnea or shortness of breath Dermatological: negative for rash Respiratory: negative for cough or wheezing Urologic: negative for hematuria Abdominal: negative for nausea, vomiting, diarrhea, bright red blood per rectum, melena, or hematemesis Neurologic: negative for visual changes, syncope, or dizziness All other systems reviewed and are otherwise negative except as noted above.    Blood pressure 132/64, pulse 65, height 5\' 4"  (1.626 m), weight 180 lb 12.8 oz (82 kg).  General appearance: alert and no distress Neck: no adenopathy, no carotid bruit, no JVD, supple, symmetrical, trachea midline and thyroid not enlarged, symmetric, no tenderness/mass/nodules Lungs: clear to auscultation bilaterally Heart: regular rate and rhythm, S1, S2 normal, no murmur, click, rub or gallop Extremities: extremities normal, atraumatic, no cyanosis or edema Pulses: 2+ and symmetric Skin: Skin color, texture, turgor normal. No rashes or lesions Neurologic: Alert and oriented X 3, normal strength and tone. Normal symmetric reflexes. Normal coordination and gait  EKG sinus rhythm at 65 without ST or T wave changes.  I personally reviewed this EKG.  ASSESSMENT AND PLAN:   Essential hypertension History of essential hypertension with blood pressure measured today 132/64.  She is on lisinopril and hydrochlorothiazide.  Hyperlipidemia History of hyperlipidemia on Crestor with lipid profile performed by her PCP 10/13/2019 revealing total cholesterol 171, LDL of 102 and HDL of 35.  Left atrial enlargement Recent 2D  echocardiogram performed at [facility] 11/10/2019 showed severe left atrial and left ventricular enlargement with normal LV function.  She had mild mitral regurgitation.  I am going to repeat a 2D echocardiogram.  She is totally asymptomatic.      Lorretta Harp MD FACP,FACC,FAHA, [facility] 02/02/2020 10:13 AM

## 2020-02-02 NOTE — Assessment & Plan Note (Signed)
History of hyperlipidemia on Crestor with lipid profile performed by her PCP 10/13/2019 revealing total cholesterol 171, LDL of 102 and HDL of 35.

## 2020-02-04 ENCOUNTER — Telehealth: Payer: Self-pay

## 2020-02-04 NOTE — Telephone Encounter (Signed)
Received referral from [facility] [phone] Referral sent to scheduling Records on File

## 2020-02-16 DIAGNOSIS — E559 Vitamin D deficiency, unspecified: Secondary | ICD-10-CM | POA: Diagnosis not present

## 2020-02-16 DIAGNOSIS — E785 Hyperlipidemia, unspecified: Secondary | ICD-10-CM | POA: Diagnosis not present

## 2020-02-16 DIAGNOSIS — E119 Type 2 diabetes mellitus without complications: Secondary | ICD-10-CM | POA: Diagnosis not present

## 2020-02-16 DIAGNOSIS — E039 Hypothyroidism, unspecified: Secondary | ICD-10-CM | POA: Diagnosis not present

## 2020-02-16 DIAGNOSIS — R5383 Other fatigue: Secondary | ICD-10-CM | POA: Diagnosis not present

## 2020-02-16 DIAGNOSIS — I10 Essential (primary) hypertension: Secondary | ICD-10-CM | POA: Diagnosis not present

## 2020-02-19 ENCOUNTER — Other Ambulatory Visit: Payer: Self-pay | Admitting: Nurse Practitioner

## 2020-02-19 DIAGNOSIS — E1165 Type 2 diabetes mellitus with hyperglycemia: Secondary | ICD-10-CM | POA: Diagnosis not present

## 2020-02-19 DIAGNOSIS — E119 Type 2 diabetes mellitus without complications: Secondary | ICD-10-CM | POA: Diagnosis not present

## 2020-02-19 DIAGNOSIS — K635 Polyp of colon: Secondary | ICD-10-CM | POA: Diagnosis not present

## 2020-02-19 DIAGNOSIS — E785 Hyperlipidemia, unspecified: Secondary | ICD-10-CM | POA: Diagnosis not present

## 2020-02-19 DIAGNOSIS — F411 Generalized anxiety disorder: Secondary | ICD-10-CM | POA: Diagnosis not present

## 2020-02-22 ENCOUNTER — Ambulatory Visit (HOSPITAL_COMMUNITY): Payer: 59 | Attending: Cardiology

## 2020-02-22 ENCOUNTER — Other Ambulatory Visit: Payer: Self-pay

## 2020-02-22 DIAGNOSIS — I517 Cardiomegaly: Secondary | ICD-10-CM | POA: Insufficient documentation

## 2020-02-22 LAB — ECHOCARDIOGRAM COMPLETE
Area-P 1/2: 4.06 cm2
S' Lateral: 2.5 cm

## 2020-04-05 ENCOUNTER — Other Ambulatory Visit: Payer: Self-pay | Admitting: Nurse Practitioner

## 2020-05-02 ENCOUNTER — Other Ambulatory Visit: Payer: Self-pay | Admitting: Nurse Practitioner

## 2020-06-09 ENCOUNTER — Other Ambulatory Visit (HOSPITAL_COMMUNITY): Payer: Self-pay | Admitting: Dentistry

## 2020-06-09 MED FILL — AMOXICILLIN 500 MG CAPSULE: 500 | 7 days supply | Qty: 21 | Fill #0

## 2020-06-16 DIAGNOSIS — H5203 Hypermetropia, bilateral: Secondary | ICD-10-CM | POA: Diagnosis not present

## 2020-06-16 DIAGNOSIS — Z7984 Long term (current) use of oral hypoglycemic drugs: Secondary | ICD-10-CM | POA: Diagnosis not present

## 2020-06-16 DIAGNOSIS — E119 Type 2 diabetes mellitus without complications: Secondary | ICD-10-CM | POA: Diagnosis not present

## 2020-06-16 DIAGNOSIS — H524 Presbyopia: Secondary | ICD-10-CM | POA: Diagnosis not present

## 2020-06-16 DIAGNOSIS — H52203 Unspecified astigmatism, bilateral: Secondary | ICD-10-CM | POA: Diagnosis not present

## 2020-07-04 ENCOUNTER — Other Ambulatory Visit: Payer: Self-pay | Admitting: Nurse Practitioner

## 2020-07-13 DIAGNOSIS — I10 Essential (primary) hypertension: Secondary | ICD-10-CM | POA: Diagnosis not present

## 2020-07-13 DIAGNOSIS — E119 Type 2 diabetes mellitus without complications: Secondary | ICD-10-CM | POA: Diagnosis not present

## 2020-07-13 DIAGNOSIS — E039 Hypothyroidism, unspecified: Secondary | ICD-10-CM | POA: Diagnosis not present

## 2020-07-13 DIAGNOSIS — E785 Hyperlipidemia, unspecified: Secondary | ICD-10-CM | POA: Diagnosis not present

## 2020-07-14 ENCOUNTER — Other Ambulatory Visit: Payer: Self-pay | Admitting: Nurse Practitioner

## 2020-07-14 DIAGNOSIS — I10 Essential (primary) hypertension: Secondary | ICD-10-CM | POA: Diagnosis not present

## 2020-07-14 DIAGNOSIS — F411 Generalized anxiety disorder: Secondary | ICD-10-CM | POA: Diagnosis not present

## 2020-07-14 DIAGNOSIS — E785 Hyperlipidemia, unspecified: Secondary | ICD-10-CM | POA: Diagnosis not present

## 2020-07-14 DIAGNOSIS — E1165 Type 2 diabetes mellitus with hyperglycemia: Secondary | ICD-10-CM | POA: Diagnosis not present

## 2020-08-17 DIAGNOSIS — Z01419 Encounter for gynecological examination (general) (routine) without abnormal findings: Secondary | ICD-10-CM | POA: Diagnosis not present

## 2020-08-17 DIAGNOSIS — Z1231 Encounter for screening mammogram for malignant neoplasm of breast: Secondary | ICD-10-CM | POA: Diagnosis not present

## 2020-08-17 DIAGNOSIS — Z124 Encounter for screening for malignant neoplasm of cervix: Secondary | ICD-10-CM | POA: Diagnosis not present

## 2020-08-17 DIAGNOSIS — Z130 Encounter for screening for diseases of the blood and blood-forming organs and certain disorders involving the immune mechanism: Secondary | ICD-10-CM | POA: Diagnosis not present

## 2020-08-17 DIAGNOSIS — Z6829 Body mass index (BMI) 29.0-29.9, adult: Secondary | ICD-10-CM | POA: Diagnosis not present

## 2020-08-17 DIAGNOSIS — Z1389 Encounter for screening for other disorder: Secondary | ICD-10-CM | POA: Diagnosis not present

## 2020-08-17 DIAGNOSIS — R8290 Unspecified abnormal findings in urine: Secondary | ICD-10-CM | POA: Diagnosis not present

## 2020-08-17 DIAGNOSIS — E669 Obesity, unspecified: Secondary | ICD-10-CM | POA: Diagnosis not present

## 2020-08-18 DIAGNOSIS — Z124 Encounter for screening for malignant neoplasm of cervix: Secondary | ICD-10-CM | POA: Diagnosis not present

## 2020-09-04 ENCOUNTER — Other Ambulatory Visit: Payer: Self-pay

## 2020-09-05 ENCOUNTER — Other Ambulatory Visit: Payer: Self-pay

## 2020-09-05 MED ORDER — VASCEPA 1 G PO CAPS
ORAL_CAPSULE | ORAL | 3 refills | Status: DC
  Filled 2020-09-05: qty 360, 90d supply, fill #0
  Filled 2021-03-16: qty 360, 90d supply, fill #1
  Filled 2021-08-02: qty 360, 90d supply, fill #2

## 2020-09-06 ENCOUNTER — Other Ambulatory Visit: Payer: Self-pay

## 2020-10-02 ENCOUNTER — Other Ambulatory Visit: Payer: Self-pay

## 2020-10-02 MED FILL — Venlafaxine HCl Cap ER 24HR 37.5 MG (Base Equivalent): ORAL | 90 days supply | Qty: 90 | Fill #0 | Status: AC

## 2020-10-02 MED FILL — Venlafaxine HCl Cap ER 24HR 150 MG (Base Equivalent): ORAL | 90 days supply | Qty: 90 | Fill #0 | Status: AC

## 2020-10-02 MED FILL — Dapagliflozin Propanediol Tab 10 MG (Base Equivalent): ORAL | 90 days supply | Qty: 90 | Fill #0 | Status: AC

## 2020-10-03 ENCOUNTER — Other Ambulatory Visit: Payer: Self-pay

## 2020-10-03 MED ORDER — LISINOPRIL-HYDROCHLOROTHIAZIDE 20-12.5 MG PO TABS
1.0000 | ORAL_TABLET | Freq: Every day | ORAL | 1 refills | Status: DC
  Filled 2020-10-03: qty 90, 90d supply, fill #0
  Filled 2020-12-28: qty 90, 90d supply, fill #1

## 2020-10-31 ENCOUNTER — Other Ambulatory Visit: Payer: Self-pay

## 2020-10-31 MED FILL — Continuous Glucose System Sensor: 28 days supply | Qty: 2 | Fill #0 | Status: AC

## 2020-11-01 ENCOUNTER — Other Ambulatory Visit: Payer: Self-pay

## 2020-11-01 MED ORDER — GLIPIZIDE ER 10 MG PO TB24
ORAL_TABLET | ORAL | 1 refills | Status: DC
  Filled 2020-11-01: qty 90, 90d supply, fill #0
  Filled 2021-01-26 – 2021-01-27 (×2): qty 90, 90d supply, fill #1

## 2020-11-01 MED ORDER — ROSUVASTATIN CALCIUM 40 MG PO TABS
40.0000 mg | ORAL_TABLET | Freq: Every day | ORAL | 1 refills | Status: DC
  Filled 2020-11-01: qty 90, 90d supply, fill #0
  Filled 2021-01-26 – 2021-01-27 (×2): qty 90, 90d supply, fill #1

## 2020-11-04 ENCOUNTER — Other Ambulatory Visit (HOSPITAL_COMMUNITY): Payer: Self-pay

## 2020-11-09 DIAGNOSIS — E1165 Type 2 diabetes mellitus with hyperglycemia: Secondary | ICD-10-CM | POA: Diagnosis not present

## 2020-11-09 DIAGNOSIS — E669 Obesity, unspecified: Secondary | ICD-10-CM | POA: Diagnosis not present

## 2020-11-09 DIAGNOSIS — E785 Hyperlipidemia, unspecified: Secondary | ICD-10-CM | POA: Diagnosis not present

## 2020-11-09 DIAGNOSIS — E039 Hypothyroidism, unspecified: Secondary | ICD-10-CM | POA: Diagnosis not present

## 2020-11-09 DIAGNOSIS — I10 Essential (primary) hypertension: Secondary | ICD-10-CM | POA: Diagnosis not present

## 2020-11-09 DIAGNOSIS — E119 Type 2 diabetes mellitus without complications: Secondary | ICD-10-CM | POA: Diagnosis not present

## 2020-11-11 DIAGNOSIS — K5904 Chronic idiopathic constipation: Secondary | ICD-10-CM | POA: Diagnosis not present

## 2020-11-11 DIAGNOSIS — E785 Hyperlipidemia, unspecified: Secondary | ICD-10-CM | POA: Diagnosis not present

## 2020-11-11 DIAGNOSIS — F411 Generalized anxiety disorder: Secondary | ICD-10-CM | POA: Diagnosis not present

## 2020-11-11 DIAGNOSIS — I10 Essential (primary) hypertension: Secondary | ICD-10-CM | POA: Diagnosis not present

## 2020-11-11 DIAGNOSIS — E1165 Type 2 diabetes mellitus with hyperglycemia: Secondary | ICD-10-CM | POA: Diagnosis not present

## 2020-11-21 ENCOUNTER — Other Ambulatory Visit: Payer: Self-pay

## 2020-11-21 ENCOUNTER — Telehealth: Payer: Self-pay | Admitting: Gastroenterology

## 2020-11-21 MED ORDER — NA SULFATE-K SULFATE-MG SULF 17.5-3.13-1.6 GM/177ML PO SOLN
ORAL | 0 refills | Status: DC
  Filled 2020-11-21: qty 354, 1d supply, fill #0

## 2020-11-21 NOTE — Telephone Encounter (Signed)
Patient wants a Colonocopy scheduled on Aug 19th if possible. Message  was sent to Frederick Peers to Follow up with patient.

## 2020-11-23 ENCOUNTER — Other Ambulatory Visit: Payer: Self-pay

## 2020-11-23 MED FILL — Semaglutide Tab 14 MG: ORAL | 90 days supply | Qty: 90 | Fill #0 | Status: AC

## 2020-12-16 ENCOUNTER — Encounter
Admission: RE | Disposition: A | Discharge status: Home / Self Care | Payer: Self-pay | Source: Ambulatory Visit | Attending: Gastroenterology

## 2020-12-16 ENCOUNTER — Ambulatory Visit: Payer: 59 | Admitting: Certified Registered"

## 2020-12-16 ENCOUNTER — Encounter: Payer: Self-pay | Admitting: Gastroenterology

## 2020-12-16 ENCOUNTER — Ambulatory Visit
Admission: RE | Admit: 2020-12-16 | Discharge: 2020-12-16 | Disposition: A | Discharge status: Home / Self Care | Payer: 59 | Source: Ambulatory Visit | Attending: Gastroenterology | Admitting: Gastroenterology

## 2020-12-16 DIAGNOSIS — Z79899 Other long term (current) drug therapy: Secondary | ICD-10-CM | POA: Diagnosis not present

## 2020-12-16 DIAGNOSIS — E785 Hyperlipidemia, unspecified: Secondary | ICD-10-CM | POA: Diagnosis not present

## 2020-12-16 DIAGNOSIS — Z9889 Other specified postprocedural states: Secondary | ICD-10-CM | POA: Diagnosis not present

## 2020-12-16 DIAGNOSIS — K648 Other hemorrhoids: Secondary | ICD-10-CM | POA: Insufficient documentation

## 2020-12-16 DIAGNOSIS — K9189 Other postprocedural complications and disorders of digestive system: Secondary | ICD-10-CM | POA: Diagnosis not present

## 2020-12-16 DIAGNOSIS — D120 Benign neoplasm of cecum: Secondary | ICD-10-CM | POA: Diagnosis not present

## 2020-12-16 DIAGNOSIS — Z86010 Personal history of colonic polyps: Secondary | ICD-10-CM | POA: Diagnosis not present

## 2020-12-16 DIAGNOSIS — K6389 Other specified diseases of intestine: Secondary | ICD-10-CM | POA: Insufficient documentation

## 2020-12-16 DIAGNOSIS — K635 Polyp of colon: Secondary | ICD-10-CM

## 2020-12-16 DIAGNOSIS — Z09 Encounter for follow-up examination after completed treatment for conditions other than malignant neoplasm: Secondary | ICD-10-CM | POA: Diagnosis present

## 2020-12-16 DIAGNOSIS — L814 Other melanin hyperpigmentation: Secondary | ICD-10-CM | POA: Diagnosis not present

## 2020-12-16 DIAGNOSIS — L905 Scar conditions and fibrosis of skin: Secondary | ICD-10-CM | POA: Diagnosis not present

## 2020-12-16 HISTORY — PX: COLONOSCOPY: SHX5424

## 2020-12-16 SURGERY — COLONOSCOPY
Anesthesia: General

## 2020-12-16 MED ORDER — PROPOFOL 10 MG/ML IV BOLUS
INTRAVENOUS | Status: DC | PRN
  Administered 2020-12-16: 10 mg via INTRAVENOUS
  Administered 2020-12-16: 50 mg via INTRAVENOUS
  Administered 2020-12-16: 20 mg via INTRAVENOUS

## 2020-12-16 MED ORDER — LIDOCAINE HCL (CARDIAC) PF 100 MG/5ML IV SOSY
PREFILLED_SYRINGE | INTRAVENOUS | Status: DC | PRN
  Administered 2020-12-16: 80 mg via INTRAVENOUS
  Administered 2020-12-16: 20 mg via INTRAVENOUS

## 2020-12-16 MED ORDER — SODIUM CHLORIDE 0.9 % IV SOLN: INTRAVENOUS | Status: DC

## 2020-12-16 MED ORDER — EPHEDRINE SULFATE 50 MG/ML IJ SOLN
INTRAMUSCULAR | Status: DC | PRN
  Administered 2020-12-16 (×2): 5 mg via INTRAVENOUS
  Administered 2020-12-16: 10 mg via INTRAVENOUS

## 2020-12-16 MED ORDER — PROPOFOL 500 MG/50ML IV EMUL
INTRAVENOUS | Status: DC | PRN
  Administered 2020-12-16: 175 ug/kg/min via INTRAVENOUS

## 2020-12-16 NOTE — Anesthesia Postprocedure Evaluation (Signed)
Anesthesia Post Note  Patient: DEMETA LIGHTBODY  Procedure(s) Performed: COLONOSCOPY  Patient location during evaluation: Endoscopy Anesthesia Type: General Level of consciousness: awake and alert Pain management: pain level controlled Vital Signs Assessment: post-procedure vital signs reviewed and stable Respiratory status: spontaneous breathing, nonlabored ventilation, respiratory function stable and patient connected to nasal cannula oxygen Cardiovascular status: blood pressure returned to baseline and stable Postop Assessment: no apparent nausea or vomiting Anesthetic complications: no   No notable events documented.   Last Vitals:  Vitals:   12/16/20 0930 12/16/20 0935  BP:  102/63  Pulse: 67 64  Resp: 14 20  Temp:    SpO2: 100% 99%    Last Pain:  Vitals:   12/16/20 0755  TempSrc: Temporal  PainSc: 0-No pain                 Precious Haws Ennifer Harston

## 2020-12-16 NOTE — H&P (Signed)
Vonda Antigua, MD [address] [place], [place], [place], [address] [id number], [place], [place], [place], [address] Phone: [phone]  Fax: [phone]  Primary Care Physician:  Danelle Berry, NP   Pre-Procedure History & Physical: HPI:  Tracy Burgess is a 55 y.o. female is here for a colonoscopy.   Past Medical History:  Diagnosis Date   Allergy    Anxiety    Depression    Diabetes mellitus without complication ([place])    Hyperlipidemia    Hypertension     Past Surgical History:  Procedure Laterality Date   CARPAL TUNNEL RELEASE Bilateral [id number]   CESAREAN SECTION  [id number]   COLONOSCOPY WITH PROPOFOL N/A 01/05/2020   Procedure: COLONOSCOPY WITH PROPOFOL;  Surgeon: Virgel Manifold, MD;  Location: [facility] ENDOSCOPY;  Service: Endoscopy;  Laterality: N/A;   TONSILLECTOMY  1992   tubes in ears  1971    Prior to Admission medications   Medication Sig Start Date End Date Taking? Authorizing Provider  lisinopril-hydrochlorothiazide (ZESTORETIC) 20-12.5 MG tablet TAKE 1 TABLET BY MOUTH ONCE A DAY 10/03/20  Yes   rosuvastatin (CRESTOR) 40 MG tablet Take 40 mg by mouth daily.   Yes [provider]  venlafaxine (EFFEXOR) 37.5 MG tablet    Yes [provider]  venlafaxine XR (EFFEXOR-XR) 150 MG 24 hr capsule Take 150 mg by mouth daily with breakfast.  08/31/15  Yes [provider]  amoxicillin (AMOXIL) 500 MG capsule TAKE 1 CAPSULE BY MOUTH THREE TIMES DAILY FOR 7 DAYS. 06/09/20 06/09/21  Royston Cowper, DDS  Blood Glucose Monitoring Suppl (FREESTYLE LITE) DEVI freestyle lite meter    [provider]  Cholecalciferol (VITAMIN D) 50 MCG (2000 UT) CAPS     [provider]  Continuous Blood Gluc Receiver (FREESTYLE LIBRE 14 DAY READER) DEVI USE TO CHECK CBGS FOUR TIMES DAILY 07/14/20 07/14/21  Danelle Berry, NP  Continuous Blood Gluc Sensor (FREESTYLE LIBRE 14 DAY SENSOR) MISC USE WITH DEVICE READER TO CHECK BLOOD SUGARS  07/14/20 07/14/21  Evern Bio H, NP  dapagliflozin propanediol (FARXIGA) 10 MG TABS tablet Take 10 mg by mouth daily.    [provider]  dapagliflozin propanediol (FARXIGA) 10 MG TABS tablet TAKE 1 TABLET BY MOUTH DAILY 07/04/20 07/04/21  Evern Bio H, NP  glipiZIDE (GLUCOTROL XL) 10 MG 24 hr tablet glipizide ER 10 mg tablet, extended release 24 hr 05/14/19   [provider]  glipiZIDE (GLUCOTROL XL) 10 MG 24 hr tablet TAKE 1 TABLET BY MOUTH DAILY IN THE MORNING FOR DIABETES 11/01/20     glucose blood test strip FreeStyle Lite Strips    [provider]  ibuprofen (ADVIL,MOTRIN) 200 MG tablet Take 200 mg by mouth every 6 (six) hours as needed.    [provider]  icosapent Ethyl (VASCEPA) 1 g capsule Take 2 g by mouth in the morning and at bedtime. 05/14/19   [provider]  icosapent Ethyl (VASCEPA) 1 g capsule TAKE 2 CAPSULES BY MOUTH EVERY MORNING AND 2 CAPSULES EVERY EVENING 06/23/19 06/22/20  Danelle Berry, NP  Lancets (FREESTYLE) lancets freestyle lancets    [provider]  lisinopril-hydrochlorothiazide (PRINZIDE,ZESTORETIC) 20-12.5 MG tablet Take 1 tablet by mouth daily.  08/17/15   [provider]  loratadine (CLARITIN) 10 MG tablet Take 10 mg by mouth daily.    [provider]  Na Sulfate-K Sulfate-Mg Sulf 17.5-3.13-1.6 GM/177ML SOLN At 5 PM the day before procedure take 1 bottle and 5 hours  before procedure take 1 bottle. 11/21/20   Lin Landsman, MD  rosuvastatin (CRESTOR) 40 MG tablet TAKE 1 TABLET BY MOUTH DAILY 11/01/20     Semaglutide (RYBELSUS) 7 MG TABS Take by mouth.    [provider]  Semaglutide 14 MG TABS TAKE 1 TABLET BY MOUTH DAILY EVERY MORNING 30 MINUTES PRIOR TO EATING WITH A SMALL SIP OF WATER 07/14/20 07/14/21  Boswell, Donnel Saxon H, NP  Semaglutide 7 MG TABS TAKE 1 TABLET BY MOUTH DAILY IN THE MORNING 30 MINUTES PRIOR TO BREAKFAST WITH A SMALL SIP OF WATER 02/19/20 02/18/21  Boswell, Donnel Saxon  H, NP  VASCEPA 1 g capsule TAKE 2 CAPSULES BY MOUTH EVERY MORNING AND 2 CAPSULES EVERY EVENING 09/05/20       Allergies as of 11/21/2020 - Review Complete 02/02/2020  Allergen Reaction Noted   Sulfamethoxazole  09/05/2015    Family History  Problem Relation Age of Onset   Breast cancer Paternal Grandmother 51   Diabetes Father     Social History   Socioeconomic History   Marital status: Single    Spouse name: Not on file   Number of children: Not on file   Years of education: Not on file   Highest education level: Not on file  Occupational History   Not on file  Tobacco Use   Smoking status: Never   Smokeless tobacco: Never  Vaping Use   Vaping Use: Never used  Substance and Sexual Activity   Alcohol use: Yes    Alcohol/week: 0.0 standard drinks   Drug use: No   Sexual activity: Not on file  Other Topics Concern   Not on file  Social History Narrative   Not on file   Social Determinants of Health   Financial Resource Strain: Not on file  Food Insecurity: Not on file  Transportation Needs: Not on file  Physical Activity: Not on file  Stress: Not on file  Social Connections: Not on file  Intimate Partner Violence: Not on file    Review of Systems: See HPI, otherwise negative ROS  Physical Exam: Constitutional: General:   Alert,  Well-developed, well-nourished, pleasant and cooperative in NAD BP 109/67   Pulse 67   Temp (!) 97.3 F (36.3 C) (Temporal)   Resp 18   Ht '5\' 4"'$  (1.626 m)   Wt 75.8 kg   LMP 09/09/2017   SpO2 (!) 9%   BMI 28.67 kg/m   Head: Normocephalic, atraumatic.   Eyes:  Sclera clear, no icterus.   Conjunctiva pink.   Mouth:  No deformity or lesions, oropharynx pink & moist.  Neck:  Supple, trachea midline  Respiratory: Normal respiratory effort  Gastrointestinal:  Soft, non-tender and non-distended without masses, hepatosplenomegaly or hernias noted.  No guarding or rebound tenderness.     Cardiac: No clubbing or edema.  No  cyanosis. Normal posterior tibial pedal pulses noted.  Lymphatic:  No significant cervical adenopathy.  Psych:  Alert and cooperative. Normal mood and affect.  Musculoskeletal:   Symmetrical without gross deformities. 5/5 Lower extremity strength bilaterally.  Skin: Warm. Intact without significant lesions or rashes. No jaundice.  Neurologic:  Face symmetrical, tongue midline, Normal sensation to touch;  grossly normal neurologically.  Psych:  Alert and oriented x3, Alert and cooperative. Normal mood and affect.  Impression/Plan: Tracy Burgess is here for a colonoscopy to be performed for history of polyps.  Risks, benefits, limitations, and alternatives regarding  colonoscopy have been reviewed with the patient.  Questions have been  answered.  All parties agreeable.   Virgel Manifold, MD  12/16/2020, 8:24 AM

## 2020-12-16 NOTE — Transfer of Care (Signed)
Immediate Anesthesia Transfer of Care Note  Patient: CECEILIA FAZZI  Procedure(s) Performed: COLONOSCOPY  Patient Location: Endoscopy Unit  Anesthesia Type:General  Level of Consciousness: awake, drowsy and patient cooperative  Airway & Oxygen Therapy: Patient Spontanous Breathing  Post-op Assessment: Report given to RN and Post -op Vital signs reviewed and stable  Post vital signs: Reviewed and stable  Last Vitals:  Vitals Value Taken Time  BP 94/61 12/16/20 0910  Temp    Pulse 84 12/16/20 0910  Resp 22 12/16/20 0910  SpO2 99 % 12/16/20 0910  Vitals shown include unvalidated device data.  Last Pain:  Vitals:   12/16/20 0755  TempSrc: Temporal  PainSc: 0-No pain         Complications: No notable events documented.

## 2020-12-16 NOTE — Anesthesia Preprocedure Evaluation (Signed)
Anesthesia Evaluation  Patient identified by MRN, date of birth, ID band Patient awake    Reviewed: Allergy & Precautions, NPO status , Patient's Chart, lab work & pertinent test results  History of Anesthesia Complications Negative for: history of anesthetic complications  Airway Mallampati: II  TM Distance: <3 FB Neck ROM: full    Dental  (+) Chipped   Pulmonary neg shortness of breath,    Pulmonary exam normal        Cardiovascular hypertension, Pt. on medications (-) anginaNormal cardiovascular exam(-) dysrhythmias      Neuro/Psych PSYCHIATRIC DISORDERS Anxiety Depression    GI/Hepatic Neg liver ROS, neg GERD  ,  Endo/Other  diabetes, Type 2, Oral Hypoglycemic Agents  Renal/GU negative Renal ROS     Musculoskeletal   Abdominal   Peds  Hematology   Anesthesia Other Findings Past Medical History: No date: Allergy No date: Anxiety No date: Depression No date: Diabetes mellitus without complication ([facility]) No date: Hyperlipidemia No date: Hypertension   Reproductive/Obstetrics                             Anesthesia Physical  Anesthesia Plan  ASA: 3  Anesthesia Plan: General   Post-op Pain Management:    Induction: Intravenous  PONV Risk Score and Plan: Propofol infusion, TIVA and Treatment may vary due to age or medical condition  Airway Management Planned: Nasal Cannula  Additional Equipment:   Intra-op Plan:   Post-operative Plan:   Informed Consent: I have reviewed the patients History and Physical, chart, labs and discussed the procedure including the risks, benefits and alternatives for the proposed anesthesia with the patient or authorized representative who has indicated his/her understanding and acceptance.     Dental Advisory Given  Plan Discussed with: Anesthesiologist, CRNA and Surgeon  Anesthesia Plan Comments: (Patient consented for risks of anesthesia  including but not limited to:  - adverse reactions to medications - risk of airway placement if required - damage to eyes, teeth, lips or other oral mucosa - nerve damage due to positioning  - sore throat or hoarseness - Damage to heart, brain, nerves, lungs, other parts of body or loss of life  Patient voiced understanding.)        Anesthesia Quick Evaluation

## 2020-12-16 NOTE — Op Note (Addendum)
[facility] Gastroenterology Patient Name: Moira Umholtz Procedure Date: 12/16/2020 8:17 AM MRN: [id number] Account #: [id number] Date of Birth: February 26, 1966 Admit Type: Outpatient Age: 55 Room: [facility] ENDO ROOM 2 Gender: Female Note Status: Finalized Procedure:             Colonoscopy Indications:           High risk colon cancer surveillance: Personal history                         of colonic polyps Providers:             Youcef Klas B. Bonna Gains MD, MD Referring MD:          Eloise Levels (Referring MD) Medicines:             Monitored Anesthesia Care Complications:         No immediate complications. Procedure:             Pre-Anesthesia Assessment:                        - Prior to the procedure, a History and Physical was                         performed, and patient medications, allergies and                         sensitivities were reviewed. The patient's tolerance                         of previous anesthesia was reviewed.                        - The risks and benefits of the procedure and the                         sedation options and risks were discussed with the                         patient. All questions were answered and informed                         consent was obtained.                        - Patient identification and proposed procedure were                         verified prior to the procedure by the physician, the                         nurse, the anesthesiologist, the anesthetist and the                         technician. The procedure was verified in the                         pre-procedure area in the procedure room in the                         endoscopy  suite.                        - Prophylactic Antibiotics: The patient does not                         require prophylactic antibiotics.                        - ASA Grade Assessment: II - A patient with mild                         systemic disease.                        -  After reviewing the risks and benefits, the patient                         was deemed in satisfactory condition to undergo the                         procedure.                        - Monitored anesthesia care was determined to be                         medically necessary for this procedure based on review                         of the patient's medical history, medications, and                         prior anesthesia history.                        - The anesthesia plan was to use monitored anesthesia                         care (MAC).                        After obtaining informed consent, the colonoscope was                         passed under direct vision. Throughout the procedure,                         the patient's blood pressure, pulse, and oxygen                         saturations were monitored continuously. The                         Colonoscope was introduced through the anus and                         advanced to the the cecum, identified by appendiceal                         orifice and ileocecal valve. The colonoscopy  was                         performed with ease. The patient tolerated the                         procedure well. The quality of the bowel preparation                         was good. Findings:      The perianal and digital rectal examinations were normal.      A post polypectomy scar was found in the cecum. Adjacent mucosal       findings include nodularity. Imaging was performed using white light and       narrow band imaging to visualize the mucosa. This was biopsied with a       cold jumbo forceps for histology. The area of nodularity was at the       margins of the scar site and it was not clear if it represents true       polyp tissue or benign mucosa. Thus it was biopsied for further       evaluation.      An area of mildly thickened folds of the mucosa was found at the       ileocecal valve. Biopsies were taken with a cold forceps  for histology.      A patchy area of mild melanosis was found in the rectum and in the       sigmoid colon. Biopsies were taken with a cold forceps for histology.      The exam was otherwise normal throughout the examined colon.      Non-bleeding internal hemorrhoids were found during retroflexion. Impression:            - Post-polypectomy scar in the cecum. Biopsied.                        - Thickened folds of the mucosa at the ileocecal                         valve. Biopsied.                        - Melanosis in the colon. Biopsied.                        - Non-bleeding internal hemorrhoids. Recommendation:        - Repeat colonoscopy date to be determined after                         pending pathology results are reviewed.                        - High fiber diet.                        - Await pathology results.                        - Continue present medications.                        - Return to primary  care physician as previously                         scheduled.                        - The findings and recommendations were discussed with                         the patient.                        - Written discharge instructions were provided to the                         patient.                        - Patient has a contact number available for                         emergencies. Procedure Code(s):     --- Professional ---                        [phone], Colonoscopy, flexible; with biopsy, single or                         multiple Diagnosis Code(s):     --- Professional ---                        Z86.010, Personal history of colonic polyps                        Z98.890, Other specified postprocedural states                        K63.89, Other specified diseases of intestine CPT copyright 2019 American Medical Association. All rights reserved. The codes documented in this report are preliminary and upon coder review may  be revised to meet current compliance  requirements.  Vonda Antigua, MD Margretta Sidle B. Bonna Gains MD, MD 12/16/2020 9:17:12 AM This report has been signed electronically. Number of Addenda: 0 Note Initiated On: 12/16/2020 8:17 AM Scope Withdrawal Time: 0 hours 29 minutes 0 seconds  Total Procedure Duration: 0 hours 35 minutes 23 seconds  Estimated Blood Loss:  Estimated blood loss: none.      [facility]

## 2020-12-19 ENCOUNTER — Encounter: Payer: Self-pay | Admitting: Gastroenterology

## 2020-12-19 LAB — SURGICAL PATHOLOGY

## 2020-12-23 ENCOUNTER — Encounter: Payer: Self-pay | Admitting: Gastroenterology

## 2020-12-28 ENCOUNTER — Other Ambulatory Visit: Payer: Self-pay

## 2020-12-28 MED ORDER — VENLAFAXINE HCL ER 150 MG PO CP24
ORAL_CAPSULE | ORAL | 3 refills | Status: DC
  Filled 2020-12-28: qty 90, 90d supply, fill #0
  Filled 2021-03-29: qty 90, 90d supply, fill #1
  Filled 2021-06-21: qty 90, 90d supply, fill #2
  Filled 2021-09-12: qty 90, 90d supply, fill #3

## 2020-12-28 MED ORDER — VENLAFAXINE HCL ER 37.5 MG PO CP24
ORAL_CAPSULE | ORAL | 3 refills | Status: DC
  Filled 2020-12-28: qty 90, 90d supply, fill #0
  Filled 2021-03-29: qty 90, 90d supply, fill #1
  Filled 2021-06-21: qty 90, 90d supply, fill #2
  Filled 2021-09-12: qty 90, 90d supply, fill #3

## 2020-12-28 MED FILL — Continuous Glucose System Sensor: 28 days supply | Qty: 2 | Fill #1 | Status: AC

## 2021-01-26 ENCOUNTER — Other Ambulatory Visit: Payer: Self-pay

## 2021-01-27 ENCOUNTER — Other Ambulatory Visit: Payer: Self-pay

## 2021-01-27 MED ORDER — FARXIGA 10 MG PO TABS
10.0000 mg | ORAL_TABLET | Freq: Every day | ORAL | 1 refills | Status: DC
  Filled 2021-01-27: qty 90, 90d supply, fill #0
  Filled 2021-04-27: qty 90, 90d supply, fill #1

## 2021-03-16 ENCOUNTER — Other Ambulatory Visit: Payer: Self-pay

## 2021-03-16 MED ORDER — RYBELSUS 14 MG PO TABS
ORAL_TABLET | ORAL | 1 refills | Status: DC
  Filled 2021-03-16: qty 90, 90d supply, fill #0
  Filled 2021-06-12: qty 90, 90d supply, fill #1

## 2021-03-16 MED FILL — Continuous Glucose System Sensor: 28 days supply | Qty: 2 | Fill #2 | Status: AC

## 2021-03-17 ENCOUNTER — Other Ambulatory Visit: Payer: Self-pay

## 2021-03-26 ENCOUNTER — Other Ambulatory Visit: Payer: Self-pay

## 2021-03-26 MED ORDER — CLENPIQ 10-3.5-12 MG-GM -GM/160ML PO SOLN
320.0000 mL | ORAL | 0 refills | Status: DC
  Filled 2021-03-26: qty 320, 1d supply, fill #0

## 2021-03-27 ENCOUNTER — Other Ambulatory Visit: Payer: Self-pay

## 2021-03-28 ENCOUNTER — Other Ambulatory Visit: Payer: Self-pay

## 2021-03-29 ENCOUNTER — Other Ambulatory Visit: Payer: Self-pay

## 2021-03-29 MED ORDER — LISINOPRIL-HYDROCHLOROTHIAZIDE 20-12.5 MG PO TABS
1.0000 | ORAL_TABLET | Freq: Every day | ORAL | 1 refills | Status: DC
  Filled 2021-03-29: qty 90, 90d supply, fill #0
  Filled 2021-06-21: qty 90, 90d supply, fill #1

## 2021-04-03 DIAGNOSIS — N930 Postcoital and contact bleeding: Secondary | ICD-10-CM | POA: Diagnosis not present

## 2021-04-03 DIAGNOSIS — N939 Abnormal uterine and vaginal bleeding, unspecified: Secondary | ICD-10-CM | POA: Diagnosis not present

## 2021-04-11 DIAGNOSIS — E785 Hyperlipidemia, unspecified: Secondary | ICD-10-CM | POA: Diagnosis not present

## 2021-04-11 DIAGNOSIS — I10 Essential (primary) hypertension: Secondary | ICD-10-CM | POA: Diagnosis not present

## 2021-04-11 DIAGNOSIS — E1165 Type 2 diabetes mellitus with hyperglycemia: Secondary | ICD-10-CM | POA: Diagnosis not present

## 2021-04-13 DIAGNOSIS — F411 Generalized anxiety disorder: Secondary | ICD-10-CM | POA: Diagnosis not present

## 2021-04-13 DIAGNOSIS — I10 Essential (primary) hypertension: Secondary | ICD-10-CM | POA: Diagnosis not present

## 2021-04-13 DIAGNOSIS — E1165 Type 2 diabetes mellitus with hyperglycemia: Secondary | ICD-10-CM | POA: Diagnosis not present

## 2021-04-13 DIAGNOSIS — R945 Abnormal results of liver function studies: Secondary | ICD-10-CM | POA: Diagnosis not present

## 2021-04-13 DIAGNOSIS — E785 Hyperlipidemia, unspecified: Secondary | ICD-10-CM | POA: Diagnosis not present

## 2021-04-20 ENCOUNTER — Encounter: Payer: Self-pay | Admitting: Gastroenterology

## 2021-04-21 ENCOUNTER — Ambulatory Visit: Payer: 59 | Admitting: Certified Registered"

## 2021-04-21 ENCOUNTER — Ambulatory Visit
Admission: RE | Admit: 2021-04-21 | Discharge: 2021-04-21 | Disposition: A | Discharge status: Home / Self Care | Payer: 59 | Source: Ambulatory Visit | Attending: Gastroenterology | Admitting: Gastroenterology

## 2021-04-21 ENCOUNTER — Encounter
Admission: RE | Disposition: A | Discharge status: Home / Self Care | Payer: Self-pay | Source: Ambulatory Visit | Attending: Gastroenterology

## 2021-04-21 ENCOUNTER — Encounter: Payer: Self-pay | Admitting: Gastroenterology

## 2021-04-21 ENCOUNTER — Other Ambulatory Visit: Payer: Self-pay

## 2021-04-21 DIAGNOSIS — F419 Anxiety disorder, unspecified: Secondary | ICD-10-CM | POA: Diagnosis not present

## 2021-04-21 DIAGNOSIS — Z86010 Personal history of colonic polyps: Secondary | ICD-10-CM

## 2021-04-21 DIAGNOSIS — D125 Benign neoplasm of sigmoid colon: Secondary | ICD-10-CM | POA: Diagnosis not present

## 2021-04-21 DIAGNOSIS — Z1211 Encounter for screening for malignant neoplasm of colon: Secondary | ICD-10-CM | POA: Insufficient documentation

## 2021-04-21 DIAGNOSIS — D120 Benign neoplasm of cecum: Secondary | ICD-10-CM | POA: Insufficient documentation

## 2021-04-21 DIAGNOSIS — K648 Other hemorrhoids: Secondary | ICD-10-CM | POA: Diagnosis not present

## 2021-04-21 DIAGNOSIS — K635 Polyp of colon: Secondary | ICD-10-CM

## 2021-04-21 HISTORY — PX: COLONOSCOPY: SHX5424

## 2021-04-21 LAB — GLUCOSE, CAPILLARY: Glucose-Capillary: 149 mg/dL — ABNORMAL HIGH (ref 70–99)

## 2021-04-21 SURGERY — COLONOSCOPY
Anesthesia: General

## 2021-04-21 MED ORDER — SODIUM CHLORIDE 0.9 % IV SOLN: INTRAVENOUS | Status: DC

## 2021-04-21 MED ORDER — PROPOFOL 500 MG/50ML IV EMUL
INTRAVENOUS | Status: AC
  Filled 2021-04-21: qty 350

## 2021-04-21 MED ORDER — PHENYLEPHRINE HCL (PRESSORS) 10 MG/ML IV SOLN
INTRAVENOUS | Status: AC
  Filled 2021-04-21: qty 1

## 2021-04-21 MED ORDER — PROPOFOL 500 MG/50ML IV EMUL
INTRAVENOUS | Status: DC | PRN
  Administered 2021-04-21: 175 ug/kg/min via INTRAVENOUS

## 2021-04-21 MED ORDER — EPHEDRINE SULFATE 50 MG/ML IJ SOLN
INTRAMUSCULAR | Status: DC | PRN
  Administered 2021-04-21 (×2): 5 mg via INTRAVENOUS
  Administered 2021-04-21: 10 mg via INTRAVENOUS
  Administered 2021-04-21: 5 mg via INTRAVENOUS
  Administered 2021-04-21: 10 mg via INTRAVENOUS

## 2021-04-21 MED ORDER — PROPOFOL 10 MG/ML IV BOLUS
INTRAVENOUS | Status: DC | PRN
  Administered 2021-04-21: 80 mg via INTRAVENOUS
  Administered 2021-04-21: 20 mg via INTRAVENOUS

## 2021-04-21 MED ORDER — LIDOCAINE HCL (CARDIAC) PF 100 MG/5ML IV SOSY
PREFILLED_SYRINGE | INTRAVENOUS | Status: DC | PRN
  Administered 2021-04-21: 100 mg via INTRAVENOUS

## 2021-04-21 MED ORDER — PROPOFOL 10 MG/ML IV BOLUS
INTRAVENOUS | Status: AC
  Filled 2021-04-21: qty 20

## 2021-04-21 NOTE — Transfer of Care (Signed)
Immediate Anesthesia Transfer of Care Note  Patient: Tracy Burgess  Procedure(s) Performed: COLONOSCOPY  Patient Location: Endoscopy Unit  Anesthesia Type:General  Level of Consciousness: awake, drowsy and patient cooperative  Airway & Oxygen Therapy: Patient Spontanous Breathing and Patient connected to face mask oxygen  Post-op Assessment: Report given to RN and Post -op Vital signs reviewed and stable  Post vital signs: Reviewed and stable  Last Vitals:  Vitals Value Taken Time  BP 90/57 04/21/21 0854  Temp 36.4 C 04/21/21 0853  Pulse 79 04/21/21 0855  Resp 25 04/21/21 0855  SpO2 99 % 04/21/21 0855  Vitals shown include unvalidated device data.  Last Pain:  Vitals:   04/21/21 0853  TempSrc: Temporal  PainSc: Asleep         Complications: No notable events documented.

## 2021-04-21 NOTE — Op Note (Addendum)
[facility] Gastroenterology Patient Name: Tracy Burgess Procedure Date: 04/21/2021 8:03 AM MRN: [id number] Account #: [id number] Date of Birth: 24-Jul-1965 Admit Type: Outpatient Age: 55 Room: [facility] ENDO ROOM 2 Gender: Female Note Status: Finalized Instrument Name: Jasper Riling [id number] Procedure:             Colonoscopy Indications:           High risk colon cancer surveillance: Personal history                         of colonic polyps Providers:             Raysa Bosak B. Bonna Gains MD, MD Referring MD:          Margurite Auerbach, [place] Medicines:             Monitored Anesthesia Care Complications:         No immediate complications. Procedure:             Pre-Anesthesia Assessment:                        - ASA Grade Assessment: II - A patient with mild                         systemic disease.                        - Prior to the procedure, a History and Physical was                         performed, and patient medications, allergies and                         sensitivities were reviewed. The patient's tolerance                         of previous anesthesia was reviewed.                        - The risks and benefits of the procedure and the                         sedation options and risks were discussed with the                         patient. All questions were answered and informed                         consent was obtained.                        - Patient identification and proposed procedure were                         verified prior to the procedure by the physician, the                         nurse, the anesthesiologist, the anesthetist and the                         technician. The  procedure was verified in the                         procedure room.                        After obtaining informed consent, the colonoscope was                         passed under direct vision. Throughout the procedure,                         the patient's blood  pressure, pulse, and oxygen                         saturations were monitored continuously. The                         Colonoscope was introduced through the anus and                         advanced to the the cecum, identified by appendiceal                         orifice and ileocecal valve. The colonoscopy was                         performed with ease. The patient tolerated the                         procedure well. The quality of the bowel preparation                         was good. Findings:      The perianal and digital rectal examinations were normal.      A localized area of white discoloration on NBI scan was found in the       cecum (peri-appendiceal). It was removed with a cold snare. Resection       and retrieval were complete. This was a 7 mm area that appeared normal       via white light, but on NBI scan showed white discoloration. The area of       discoloration was completely removed via cold snare in one piece (Jar       A). It was found to be in the peri-appendiceal area of the cecum. This       likely represents the previous scar site noted on her 2022 colonoscopy.       The last colonoscopy in 2022 showed polyp appearing tissue at the scar       site on white light endoscopy and today's colonoscopy did not reveal any       obvious polyp tissue on white light endoscopy. The discolored mucosa was       only visible via NBI scan. After the above area was removed via cold       snare as described, biopsies were done around the periappendiceal area       in the cecum via jumbo foceps in a circumferential pattern to rule out       any other polyp tissue in the area (since an obvious polyp  tissue was       not visible elsewhere otherwise) and placed in a separate Jar. Biopsies       were taken with a cold forceps for histology.      A 4 mm polyp was found in the sigmoid colon. The polyp was sessile. The       polyp was removed with a cold snare. Resection and  retrieval were       complete.      The exam was otherwise without abnormality.      The rectum, sigmoid colon, descending colon, transverse colon, ascending       colon and cecum appeared normal.      Non-bleeding internal hemorrhoids were found during retroflexion. Impression:            - White discoloration on NBI scan mucosa in the cecum.                         Snared and Biopsied as detailed above.                        - One 4 mm polyp in the sigmoid colon, removed with a                         cold snare. Resected and retrieved.                        - The examination was otherwise normal.                        - The rectum, sigmoid colon, descending colon,                         transverse colon, ascending colon and cecum are normal.                        - Non-bleeding internal hemorrhoids. Recommendation:        - Discharge patient to home (with escort).                        - Advance diet as tolerated.                        - Continue present medications.                        - Await pathology results.                        - Repeat colonoscopy date to be determined after                         pending pathology results are reviewed.                        - The findings and recommendations were discussed with                         the patient.                        - The findings  and recommendations were discussed with                         the patient's family.                        - Return to primary care physician as previously                         scheduled.                        - High fiber diet. Procedure Code(s):     --- Professional ---                        [phone], Colonoscopy, flexible; with removal of                         tumor(s), polyp(s), or other lesion(s) by snare                         technique Diagnosis Code(s):     --- Professional ---                        Z86.010, Personal history of colonic polyps                         K63.5, Polyp of colon CPT copyright 2019 American Medical Association. All rights reserved. The codes documented in this report are preliminary and upon coder review may  be revised to meet current compliance requirements.  Vonda Antigua, MD Margretta Sidle B. Bonna Gains MD, MD 04/21/2021 9:11:16 AM This report has been signed electronically. Number of Addenda: 0 Note Initiated On: 04/21/2021 8:03 AM Scope Withdrawal Time: 0 hours 33 minutes 55 seconds  Total Procedure Duration: 0 hours 38 minutes 18 seconds  Estimated Blood Loss:  Estimated blood loss: none.      [facility]

## 2021-04-21 NOTE — Anesthesia Postprocedure Evaluation (Signed)
Anesthesia Post Note  Patient: Tracy Burgess  Procedure(s) Performed: COLONOSCOPY  Patient location during evaluation: Phase II Anesthesia Type: General Level of consciousness: awake and alert, awake and oriented Pain management: pain level controlled Vital Signs Assessment: post-procedure vital signs reviewed and stable Respiratory status: spontaneous breathing, nonlabored ventilation and respiratory function stable Cardiovascular status: blood pressure returned to baseline and stable Postop Assessment: no apparent nausea or vomiting Anesthetic complications: no   No notable events documented.   Last Vitals:  Vitals:   04/21/21 0913 04/21/21 0923  BP: 110/63 92/62  Pulse:    Resp:    Temp:    SpO2:      Last Pain:  Vitals:   04/21/21 0923  TempSrc:   PainSc: 0-No pain                 Phill Mutter

## 2021-04-21 NOTE — H&P (Signed)
Vonda Antigua, MD [address] [place], [place], [place], [address] [address], [place], [place], [place], [address] Phone: [phone]  Fax: [phone]  Primary Care Physician:  Danelle Berry, NP   Pre-Procedure History & Physical: HPI:  Tracy Burgess is a 55 y.o. female is here for a colonoscopy.   Past Medical History:  Diagnosis Date   Allergy    Anxiety    Depression    Diabetes mellitus without complication ([place])    Hyperlipidemia    Hypertension     Past Surgical History:  Procedure Laterality Date   CARPAL TUNNEL RELEASE Bilateral [id number]   CESAREAN SECTION  [id number]   COLONOSCOPY N/A 12/16/2020   Procedure: COLONOSCOPY;  Surgeon: Virgel Manifold, MD;  Location: [facility] ENDOSCOPY;  Service: Endoscopy;  Laterality: N/A;   COLONOSCOPY WITH PROPOFOL N/A 01/05/2020   Procedure: COLONOSCOPY WITH PROPOFOL;  Surgeon: Virgel Manifold, MD;  Location: [facility] ENDOSCOPY;  Service: Endoscopy;  Laterality: N/A;   TONSILLECTOMY  1992   tubes in ears  1971    Prior to Admission medications   Medication Sig Start Date End Date Taking? Authorizing Provider  Cholecalciferol (VITAMIN D) 50 MCG (2000 UT) CAPS    Yes [provider]  dapagliflozin propanediol (FARXIGA) 10 MG TABS tablet Take 10 mg by mouth daily.   Yes [provider]  glipiZIDE (GLUCOTROL XL) 10 MG 24 hr tablet glipizide ER 10 mg tablet, extended release 24 hr 05/14/19  Yes [provider]  icosapent Ethyl (VASCEPA) 1 g capsule Take 2 g by mouth in the morning and at bedtime. 05/14/19  Yes [provider]  lisinopril-hydrochlorothiazide (ZESTORETIC) 20-12.5 MG tablet TAKE 1 TABLET BY MOUTH ONCE A DAY 03/29/21  Yes   rosuvastatin (CRESTOR) 40 MG tablet TAKE 1 TABLET BY MOUTH DAILY 11/01/20  Yes   venlafaxine XR (EFFEXOR-XR) 150 MG 24 hr capsule TAKE 1 CAPSULE BY MOUTH DAILY IN ADDITION TO 37.5 MG FOR TOTAL OF 187.5 MG DAILY 12/28/20  Yes   venlafaxine XR  (EFFEXOR-XR) 37.5 MG 24 hr capsule TAKE 1 CAPSULE BY MOUTH DAILY IN ADDITION TO 150 MG ER FOR A TOTAL OF 187.5 MG DAILY 12/28/20  Yes   amoxicillin (AMOXIL) 500 MG capsule TAKE 1 CAPSULE BY MOUTH THREE TIMES DAILY FOR 7 DAYS. Patient not taking: Reported on 04/21/2021 06/09/20 06/09/21  Royston Cowper, DDS  Blood Glucose Monitoring Suppl (FREESTYLE LITE) DEVI freestyle lite meter    [provider]  Continuous Blood Gluc Receiver (FREESTYLE LIBRE 14 DAY READER) DEVI USE TO CHECK CBGS FOUR TIMES DAILY 07/14/20 07/14/21  Danelle Berry, NP  Continuous Blood Gluc Sensor (FREESTYLE LIBRE 14 DAY SENSOR) MISC USE WITH DEVICE READER TO CHECK BLOOD SUGARS 07/14/20 07/14/21  Evern Bio H, NP  dapagliflozin propanediol (FARXIGA) 10 MG TABS tablet TAKE 1 TABLET BY MOUTH DAILY 01/27/21     glipiZIDE (GLUCOTROL XL) 10 MG 24 hr tablet TAKE 1 TABLET BY MOUTH DAILY IN THE MORNING FOR DIABETES 11/01/20     glucose blood test strip FreeStyle Lite Strips    [provider]  ibuprofen (ADVIL,MOTRIN) 200 MG tablet Take 200 mg by mouth every 6 (six) hours as needed.    [provider]  icosapent Ethyl (VASCEPA) 1 g capsule TAKE 2 CAPSULES BY MOUTH EVERY MORNING AND 2 CAPSULES EVERY EVENING 06/23/19 06/22/20  Danelle Berry, NP  Lancets (FREESTYLE) lancets freestyle lancets    [provider]  lisinopril-hydrochlorothiazide (PRINZIDE,ZESTORETIC) 20-12.5 MG tablet Take  1 tablet by mouth daily.  08/17/15   [provider]  loratadine (CLARITIN) 10 MG tablet Take 10 mg by mouth daily.    [provider]  rosuvastatin (CRESTOR) 40 MG tablet Take 40 mg by mouth daily.    [provider]  Semaglutide (RYBELSUS) 14 MG TABS Take 1 tablet by mouth daily in AM 30 min prior to eating with a small sip of water 03/16/21     Semaglutide (RYBELSUS) 7 MG TABS Take by mouth.    [provider]  Semaglutide 14 MG TABS TAKE 1 TABLET BY MOUTH DAILY EVERY MORNING 30 MINUTES  PRIOR TO EATING WITH A SMALL SIP OF WATER 07/14/20 07/14/21  Boswell, Ardis Rowan, NP  Sod Picosulfate-Mag Ox-Cit Acd (CLENPIQ) 10-3.5-12 MG-GM -GM/160ML SOLN Take 320 mLs by mouth as directed. 03/26/21   Virgel Manifold, MD  VASCEPA 1 g capsule TAKE 2 CAPSULES BY MOUTH EVERY MORNING AND 2 CAPSULES EVERY EVENING 09/05/20     venlafaxine (EFFEXOR) 37.5 MG tablet     [provider]  venlafaxine XR (EFFEXOR-XR) 150 MG 24 hr capsule Take 150 mg by mouth daily with breakfast.  08/31/15   [provider]    Allergies as of 03/15/2021 - Review Complete 12/16/2020  Allergen Reaction Noted   Sulfamethoxazole  09/05/2015    Family History  Problem Relation Age of Onset   Breast cancer Paternal Grandmother 22   Diabetes Father     Social History   Socioeconomic History   Marital status: Single    Spouse name: Not on file   Number of children: Not on file   Years of education: Not on file   Highest education level: Not on file  Occupational History   Not on file  Tobacco Use   Smoking status: Never   Smokeless tobacco: Never  Vaping Use   Vaping Use: Never used  Substance and Sexual Activity   Alcohol use: Yes    Alcohol/week: 0.0 standard drinks   Drug use: No   Sexual activity: Not on file  Other Topics Concern   Not on file  Social History Narrative   Not on file   Social Determinants of Health   Financial Resource Strain: Not on file  Food Insecurity: Not on file  Transportation Needs: Not on file  Physical Activity: Not on file  Stress: Not on file  Social Connections: Not on file  Intimate Partner Violence: Not on file    Review of Systems: See HPI, otherwise negative ROS  Physical Exam: Constitutional: General:   Alert,  Well-developed, well-nourished, pleasant and cooperative in NAD BP 121/75    Pulse 82    Temp (!) 97.4 F (36.3 C) (Temporal)    Resp 16    Ht 5\' 3"  (1.6 m)    Wt 73 kg    LMP 09/09/2017    SpO2 97%    BMI 28.52 kg/m   Head:  Normocephalic, atraumatic.   Eyes:  Sclera clear, no icterus.   Conjunctiva pink.   Mouth:  No deformity or lesions, oropharynx pink & moist.  Neck:  Supple, trachea midline  Respiratory: Normal respiratory effort  Gastrointestinal:  Soft, non-tender and non-distended without masses, hepatosplenomegaly or hernias noted.  No guarding or rebound tenderness.     Cardiac: No clubbing or edema.  No cyanosis. Normal posterior tibial pedal pulses noted.  Lymphatic:  No significant cervical adenopathy.  Psych:  Alert and cooperative. Normal mood and affect.  Musculoskeletal:  Symmetrical without gross deformities. 5/5 Lower extremity strength bilaterally.  Skin: Warm. Intact without significant lesions or rashes. No jaundice.  Neurologic:  Face symmetrical, tongue midline, Normal sensation to touch;  grossly normal neurologically.  Psych:  Alert and oriented x3, Alert and cooperative. Normal mood and affect.  Impression/Plan: KAOIR LOREE is here for a colonoscopy to be performed for history of polyps and piecemeal polypectomy.  Risks, benefits, limitations, and alternatives regarding  colonoscopy have been reviewed with the patient.  Questions have been answered.  All parties agreeable.   Virgel Manifold, MD  04/21/2021, 8:05 AM

## 2021-04-21 NOTE — Addendum Note (Signed)
Addendum  created 04/21/21 1034 by Kelton Pillar, CRNA   Intraprocedure Meds edited

## 2021-04-21 NOTE — Anesthesia Preprocedure Evaluation (Signed)
Anesthesia Evaluation  Patient identified by MRN, date of birth, ID band Patient awake    Reviewed: Allergy & Precautions, NPO status , Patient's Chart, lab work & pertinent test results  History of Anesthesia Complications Negative for: history of anesthetic complications  Airway Mallampati: II  TM Distance: <3 FB Neck ROM: full    Dental  (+) Chipped   Pulmonary neg shortness of breath,    Pulmonary exam normal        Cardiovascular hypertension, Pt. on medications (-) anginaNormal cardiovascular exam(-) dysrhythmias      Neuro/Psych PSYCHIATRIC DISORDERS Anxiety Depression    GI/Hepatic Neg liver ROS, neg GERD  ,  Endo/Other  diabetes, Type 2, Oral Hypoglycemic Agents  Renal/GU negative Renal ROS     Musculoskeletal   Abdominal   Peds  Hematology   Anesthesia Other Findings Past Medical History: No date: Allergy No date: Anxiety No date: Depression No date: Diabetes mellitus without complication ([facility]) No date: Hyperlipidemia No date: Hypertension   Reproductive/Obstetrics                             Anesthesia Physical  Anesthesia Plan  ASA: 3  Anesthesia Plan: General   Post-op Pain Management:    Induction: Intravenous  PONV Risk Score and Plan: 2 and Propofol infusion, TIVA and Treatment may vary due to age or medical condition  Airway Management Planned: Nasal Cannula and Natural Airway  Additional Equipment:   Intra-op Plan:   Post-operative Plan:   Informed Consent: I have reviewed the patients History and Physical, chart, labs and discussed the procedure including the risks, benefits and alternatives for the proposed anesthesia with the patient or authorized representative who has indicated his/her understanding and acceptance.       Plan Discussed with: Anesthesiologist, CRNA and Surgeon  Anesthesia Plan Comments: (Patient consented for risks of  anesthesia including but not limited to:  - adverse reactions to medications - risk of airway placement if required - damage to eyes, teeth, lips or other oral mucosa - nerve damage due to positioning  - sore throat or hoarseness - Damage to heart, brain, nerves, lungs, other parts of body or loss of life  Patient voiced understanding.)        Anesthesia Quick Evaluation

## 2021-04-26 LAB — SURGICAL PATHOLOGY

## 2021-04-27 ENCOUNTER — Other Ambulatory Visit: Payer: Self-pay

## 2021-04-27 ENCOUNTER — Encounter: Payer: Self-pay | Admitting: Gastroenterology

## 2021-04-27 MED ORDER — ROSUVASTATIN CALCIUM 40 MG PO TABS
40.0000 mg | ORAL_TABLET | Freq: Every day | ORAL | 1 refills | Status: DC
  Filled 2021-04-27: qty 90, 90d supply, fill #0
  Filled 2021-08-02: qty 90, 90d supply, fill #1

## 2021-05-02 ENCOUNTER — Other Ambulatory Visit: Payer: Self-pay

## 2021-05-02 DIAGNOSIS — N939 Abnormal uterine and vaginal bleeding, unspecified: Secondary | ICD-10-CM | POA: Diagnosis not present

## 2021-05-02 MED ORDER — GLIPIZIDE ER 10 MG PO TB24
ORAL_TABLET | ORAL | 1 refills | Status: DC
  Filled 2021-05-02: qty 90, 90d supply, fill #0
  Filled 2021-08-02: qty 90, 90d supply, fill #1

## 2021-05-10 DIAGNOSIS — N930 Postcoital and contact bleeding: Secondary | ICD-10-CM | POA: Diagnosis not present

## 2021-05-10 DIAGNOSIS — D261 Other benign neoplasm of corpus uteri: Secondary | ICD-10-CM | POA: Diagnosis not present

## 2021-05-17 ENCOUNTER — Other Ambulatory Visit: Payer: Self-pay

## 2021-05-17 MED ORDER — MEDROXYPROGESTERONE ACETATE 10 MG PO TABS
ORAL_TABLET | ORAL | 0 refills | Status: DC
  Filled 2021-05-17: qty 30, 30d supply, fill #0

## 2021-06-12 ENCOUNTER — Other Ambulatory Visit: Payer: Self-pay

## 2021-06-21 ENCOUNTER — Other Ambulatory Visit: Payer: Self-pay

## 2021-06-21 MED ORDER — ESTRADIOL 0.1 MG/GM VA CREA
TOPICAL_CREAM | VAGINAL | 0 refills | Status: DC
  Filled 2021-06-21: qty 42.5, 30d supply, fill #0

## 2021-06-22 DIAGNOSIS — H524 Presbyopia: Secondary | ICD-10-CM | POA: Diagnosis not present

## 2021-06-22 DIAGNOSIS — H52203 Unspecified astigmatism, bilateral: Secondary | ICD-10-CM | POA: Diagnosis not present

## 2021-06-22 DIAGNOSIS — E119 Type 2 diabetes mellitus without complications: Secondary | ICD-10-CM | POA: Diagnosis not present

## 2021-06-22 DIAGNOSIS — Z7984 Long term (current) use of oral hypoglycemic drugs: Secondary | ICD-10-CM | POA: Diagnosis not present

## 2021-06-22 DIAGNOSIS — H5203 Hypermetropia, bilateral: Secondary | ICD-10-CM | POA: Diagnosis not present

## 2021-07-10 ENCOUNTER — Other Ambulatory Visit: Payer: Self-pay

## 2021-07-10 MED FILL — Continuous Glucose System Sensor: 28 days supply | Qty: 2 | Fill #3 | Status: AC

## 2021-07-17 DIAGNOSIS — N950 Postmenopausal bleeding: Secondary | ICD-10-CM | POA: Diagnosis not present

## 2021-07-31 DIAGNOSIS — I10 Essential (primary) hypertension: Secondary | ICD-10-CM | POA: Diagnosis not present

## 2021-07-31 DIAGNOSIS — E1165 Type 2 diabetes mellitus with hyperglycemia: Secondary | ICD-10-CM | POA: Diagnosis not present

## 2021-07-31 DIAGNOSIS — E785 Hyperlipidemia, unspecified: Secondary | ICD-10-CM | POA: Diagnosis not present

## 2021-08-02 ENCOUNTER — Other Ambulatory Visit: Payer: Self-pay

## 2021-08-02 MED ORDER — FARXIGA 10 MG PO TABS
10.0000 mg | ORAL_TABLET | Freq: Every day | ORAL | 1 refills | Status: DC
  Filled 2021-08-02: qty 90, 90d supply, fill #0
  Filled 2021-11-01: qty 90, 90d supply, fill #1

## 2021-08-03 ENCOUNTER — Other Ambulatory Visit: Payer: Self-pay

## 2021-08-03 DIAGNOSIS — E785 Hyperlipidemia, unspecified: Secondary | ICD-10-CM | POA: Diagnosis not present

## 2021-08-03 DIAGNOSIS — I10 Essential (primary) hypertension: Secondary | ICD-10-CM | POA: Diagnosis not present

## 2021-08-03 DIAGNOSIS — E1165 Type 2 diabetes mellitus with hyperglycemia: Secondary | ICD-10-CM | POA: Diagnosis not present

## 2021-08-03 DIAGNOSIS — R945 Abnormal results of liver function studies: Secondary | ICD-10-CM | POA: Diagnosis not present

## 2021-08-03 DIAGNOSIS — F411 Generalized anxiety disorder: Secondary | ICD-10-CM | POA: Diagnosis not present

## 2021-08-10 ENCOUNTER — Other Ambulatory Visit: Payer: Self-pay | Admitting: Nurse Practitioner

## 2021-08-10 ENCOUNTER — Other Ambulatory Visit (HOSPITAL_COMMUNITY): Payer: Self-pay | Admitting: Nurse Practitioner

## 2021-08-10 DIAGNOSIS — R945 Abnormal results of liver function studies: Secondary | ICD-10-CM

## 2021-08-21 ENCOUNTER — Other Ambulatory Visit: Payer: Self-pay

## 2021-08-21 DIAGNOSIS — Z0142 Encounter for cervical smear to confirm findings of recent normal smear following initial abnormal smear: Secondary | ICD-10-CM | POA: Diagnosis not present

## 2021-08-21 DIAGNOSIS — Z124 Encounter for screening for malignant neoplasm of cervix: Secondary | ICD-10-CM | POA: Diagnosis not present

## 2021-08-21 DIAGNOSIS — Z1272 Encounter for screening for malignant neoplasm of vagina: Secondary | ICD-10-CM | POA: Diagnosis not present

## 2021-08-21 DIAGNOSIS — Z130 Encounter for screening for diseases of the blood and blood-forming organs and certain disorders involving the immune mechanism: Secondary | ICD-10-CM | POA: Diagnosis not present

## 2021-08-21 DIAGNOSIS — Z1151 Encounter for screening for human papillomavirus (HPV): Secondary | ICD-10-CM | POA: Diagnosis not present

## 2021-08-21 DIAGNOSIS — Z01419 Encounter for gynecological examination (general) (routine) without abnormal findings: Secondary | ICD-10-CM | POA: Diagnosis not present

## 2021-08-21 DIAGNOSIS — Z1389 Encounter for screening for other disorder: Secondary | ICD-10-CM | POA: Diagnosis not present

## 2021-08-21 DIAGNOSIS — Z79890 Hormone replacement therapy (postmenopausal): Secondary | ICD-10-CM | POA: Diagnosis not present

## 2021-08-21 DIAGNOSIS — Z6829 Body mass index (BMI) 29.0-29.9, adult: Secondary | ICD-10-CM | POA: Diagnosis not present

## 2021-08-21 DIAGNOSIS — Z1231 Encounter for screening mammogram for malignant neoplasm of breast: Secondary | ICD-10-CM | POA: Diagnosis not present

## 2021-08-21 DIAGNOSIS — E669 Obesity, unspecified: Secondary | ICD-10-CM | POA: Diagnosis not present

## 2021-08-21 MED ORDER — ESTRADIOL 0.1 MG/GM VA CREA
TOPICAL_CREAM | VAGINAL | 4 refills | Status: DC
  Filled 2021-08-21: qty 42.5, 30d supply, fill #0
  Filled 2021-12-27: qty 42.5, 90d supply, fill #1
  Filled 2022-03-26: qty 42.5, 90d supply, fill #2
  Filled 2022-06-26: qty 42.5, 90d supply, fill #3

## 2021-08-22 ENCOUNTER — Other Ambulatory Visit: Payer: Self-pay

## 2021-08-22 ENCOUNTER — Ambulatory Visit
Admission: RE | Admit: 2021-08-22 | Discharge: 2021-08-22 | Disposition: A | Discharge status: Home / Self Care | Payer: 59 | Source: Ambulatory Visit | Attending: Nurse Practitioner | Admitting: Nurse Practitioner

## 2021-08-22 DIAGNOSIS — R945 Abnormal results of liver function studies: Secondary | ICD-10-CM | POA: Diagnosis not present

## 2021-08-28 ENCOUNTER — Telehealth (INDEPENDENT_AMBULATORY_CARE_PROVIDER_SITE_OTHER): Payer: 59 | Admitting: Gastroenterology

## 2021-08-28 DIAGNOSIS — R7989 Other specified abnormal findings of blood chemistry: Secondary | ICD-10-CM | POA: Diagnosis not present

## 2021-08-28 NOTE — Progress Notes (Signed)
?  ?Tracy Burgess  ?[address]  ?[address]  ?[place], [place] [address]  ?Main: [phone]  ?Fax: [phone] ? ? ?Gastroenterology Consultation ? ?Referring Provider:     Self  ?Primary Care Physician:  Danelle Berry, NP ?Reason for Consultation:    Abnormal lft's  ?      ? HPI:   ?Virtual Visit via Telephone Note ? ?I connected with patient on 08/28/21 at  3:30 PM EDT by telephone and verified that I am speaking with the correct person using two identifiers. ?  ?I discussed the limitations, risks, security and privacy concerns of performing an evaluation and management service by telephone and the availability of in person appointments. I also discussed with the patient that there may be a patient responsible charge related to this service. The patient expressed understanding and agreed to proceed. ? ?Location of the patient: Home ?Location of provider: Home ?Participating persons: Patient and provider only ? ? ?History of Present Illness: ?Chief Complaint  ?Patient presents with  ? abnormal lft  ?  ? ?SEMAJA Burgess is a 56 y.o. 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  She had labs which she showed me which demonstrated mild elevation in ALT with normal AST as well as alkaline phosphatase and total bilirubin.  No joint pains.  No excess alcohol consumption, no tattoos, no high risk behavior, no incarceration, no illegal drug use.  She did have fatty infiltration suggestive of steatosis on ultrasound of the liver..   ? ?Past Medical History:  ?Diagnosis Date  ? Allergy   ? Anxiety   ? Depression   ? Diabetes mellitus without complication ([place])   ? Hyperlipidemia   ? Hypertension   ? ? ?Past Surgical History:  ?Procedure Laterality Date  ? CARPAL TUNNEL RELEASE Bilateral 2004,2005  ? CESAREAN SECTION  [phone]  ? COLONOSCOPY N/A 12/16/2020  ? Procedure: COLONOSCOPY;  Surgeon: Virgel Manifold, MD;  Location: [facility] ENDOSCOPY;   Service: Endoscopy;  Laterality: N/A;  ? COLONOSCOPY N/A 04/21/2021  ? Procedure: COLONOSCOPY;  Surgeon: Virgel Manifold, MD;  Location: [facility] ENDOSCOPY;  Service: Endoscopy;  Laterality: N/A;  ? COLONOSCOPY WITH PROPOFOL N/A 01/05/2020  ? Procedure: COLONOSCOPY WITH PROPOFOL;  Surgeon: Virgel Manifold, MD;  Location: [facility] ENDOSCOPY;  Service: Endoscopy;  Laterality: N/A;  ? TONSILLECTOMY  1992  ? tubes in ears  1971  ? ? ?Prior to Admission medications   ?Medication Sig Start Date End Date Taking? Authorizing Provider  ?Blood Glucose Monitoring Suppl (FREESTYLE LITE) DEVI freestyle lite meter    [provider]  ?Cholecalciferol (VITAMIN D) 50 MCG (2000 UT) CAPS     [provider]  ?dapagliflozin propanediol (FARXIGA) 10 MG TABS tablet Take 10 mg by mouth daily.    [provider]  ?dapagliflozin propanediol (FARXIGA) 10 MG TABS tablet TAKE 1 TABLET BY MOUTH DAILY 08/02/21     ?estradiol (ESTRACE) 0.1 MG/GM vaginal cream Insert 1 g 3 times a week by vaginal route at bedtime for 365 days. 08/21/21     ?glipiZIDE (GLUCOTROL XL) 10 MG 24 hr tablet glipizide ER 10 mg tablet, extended release 24 hr 05/14/19   [provider]  ?glipiZIDE (GLUCOTROL XL) 10 MG 24 hr tablet TAKE 1 TABLET BY MOUTH DAILY IN THE MORNING FOR DIABETES 05/02/21     ?glucose blood test strip FreeStyle Lite Strips    [provider]  ?ibuprofen (ADVIL,MOTRIN) 200 MG tablet Take 200 mg by mouth every 6 (six) hours as needed.    [provider]  ?icosapent Ethyl (VASCEPA) 1 g capsule Take 2 g by mouth in the morning and at bedtime. 05/14/19   [provider]  ?icosapent Ethyl (VASCEPA) 1 g capsule TAKE 2 CAPSULES BY MOUTH EVERY MORNING AND 2 CAPSULES EVERY EVENING 06/23/19 06/22/20  Danelle Berry, NP  ?Lancets (FREESTYLE) lancets freestyle lancets    [provider]  ?lisinopril-hydrochlorothiazide (PRINZIDE,ZESTORETIC) 20-12.5 MG tablet Take 1 tablet by mouth daily.  08/17/15    [provider]  ?lisinopril-hydrochlorothiazide (ZESTORETIC) 20-12.5 MG tablet TAKE 1 TABLET BY MOUTH ONCE A DAY 03/29/21     ?loratadine (CLARITIN) 10 MG tablet Take 10 mg by mouth daily.    [provider]  ?medroxyPROGESTERone (PROVERA) 10 MG tablet Take 1 tablet every day by oral route as directed for 30 days. 05/17/21     ?rosuvastatin (CRESTOR) 40 MG tablet Take 40 mg by mouth daily.    [provider]  ?rosuvastatin (CRESTOR) 40 MG tablet TAKE 1 TABLET BY MOUTH DAILY 04/27/21     ?Semaglutide (RYBELSUS) 14 MG TABS Take 1 tablet by mouth daily in AM 30 min prior to eating with a small sip of water 03/16/21     ?Semaglutide (RYBELSUS) 7 MG TABS Take by mouth.    [provider]  ?Sod Picosulfate-Mag Ox-Cit Acd (CLENPIQ) 10-3.5-12 MG-GM -GM/160ML SOLN Take 320 mLs by mouth as directed. 03/26/21   Virgel Manifold, MD  ?VASCEPA 1 g capsule TAKE 2 CAPSULES BY MOUTH EVERY MORNING AND 2 CAPSULES EVERY EVENING 09/05/20     ?venlafaxine (EFFEXOR) 37.5 MG tablet     [provider]  ?venlafaxine XR (EFFEXOR-XR) 150 MG 24 hr capsule Take 150 mg by mouth daily with breakfast.  08/31/15   [provider]  ?venlafaxine XR (EFFEXOR-XR) 150 MG 24 hr capsule TAKE 1 CAPSULE BY MOUTH DAILY IN ADDITION TO 37.5 MG FOR TOTAL OF 187.5 MG DAILY 12/28/20     ?venlafaxine XR (EFFEXOR-XR) 37.5 MG 24 hr capsule TAKE 1 CAPSULE BY MOUTH DAILY IN ADDITION TO 150 MG ER FOR A TOTAL OF 187.5 MG DAILY 12/28/20     ? ? ?Family History  ?Problem Relation Age of Onset  ? Breast cancer Paternal Grandmother 71  ? Diabetes Father   ?  ? ?Social History  ? ?Tobacco Use  ? Smoking status: Never  ? Smokeless tobacco: Never  ?Vaping Use  ? Vaping Use: Never used  ?Substance Use Topics  ? Alcohol use: Yes  ?  Alcohol/week: 0.0 standard drinks  ? Drug use: No  ? ? ?Allergies as of 08/28/2021 - Review Complete 04/21/2021  ?Allergen Reaction Noted  ? Sulfamethoxazole  09/05/2015  ? ? ?Review of Systems:     ?All systems reviewed and negative except where noted in HPI. ? ? ?Observations/Objective: ? ?Labs: ?CBC ?No results found for: WBC, RBC, HGB, HCT, PLT, MCV, MCH, MCHC, RDW, LYMPHSABS, MONOABS, EOSABS, BASOSABS ?CMP  ?No results found for: NA, K, CL, CO2, GLUCOSE, BUN, CREATININE, CALCIUM, PROT, ALBUMIN, AST, ALT, ALKPHOS, BILITOT, GFRNONAA, GFRAA ? ?Imaging Studies: ?US Abdomen Limited RUQ (LIVER/GB) ? ?Result Date: 08/22/2021 ?CLINICAL DATA:  Abnormal LFTs EXAM: ULTRASOUND ABDOMEN LIMITED RIGHT UPPER QUADRANT COMPARISON:  None. FINDINGS: Gallbladder: No gallstones or wall thickening visualized. No sonographic Murphy sign noted by sonographer. Common bile duct: Diameter: 4 mm Liver: Mildly increased echogenicity of the parenchyma  with no focal mass identified. Portal vein is patent on color Doppler imaging with normal direction of blood flow towards the liver. Other: None. IMPRESSION: Mildly increased echogenicity of the liver parenchyma which could represent mild hepatic steatosis and/or other hepatocellular disease. No hepatic mass visualized. Electronically Signed   By: Ofilia Neas M.D.   On: 08/22/2021 16:40   ? ?Assessment and Plan:  ? ?Tracy Burgess is a 56 y.o. y/o female has been referred for isolated elevation of ALT.  Hepatic steatosis seen on ultrasound.  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  Discussed about conservative management including weight loss, healthy eating, limited alcohol consumption.  I will perform a full autoimmune and viral hepatitis work-up to ensure there is no coexisting autoimmune liver disease. ? ? ? ? ?I discussed the assessment and treatment plan with the patient. The patient was provided an opportunity to ask questions and all were answered. The patient agreed with the plan and demonstrated an understanding of the instructions. ?  ?The patient was advised to call back or seek an in-person evaluation if the symptoms worsen or if the condition fails to  improve as anticipated. ? ?I provided 12 minutes of non-face-to-face time during this encounter. ? ? ?Dr Tracy Bellows MD,MRCP [facility]) ?Gastroenterology/Hepatology ?Pager: [phone] ? ? ?Speech recogniti

## 2021-08-31 DIAGNOSIS — R7989 Other specified abnormal findings of blood chemistry: Secondary | ICD-10-CM | POA: Diagnosis not present

## 2021-09-05 LAB — HEPATITIS B E ANTIGEN: Hep B E Ag: NEGATIVE

## 2021-09-05 LAB — CELIAC DISEASE AB SCREEN W/RFX
Antigliadin Abs, IgA: 3 units (ref 0–19)
Transglutaminase IgA: <2 U/mL (ref 0–3)

## 2021-09-05 LAB — IMMUNOGLOBULINS A/E/G/M, SERUM
IgA/Immunoglobulin A, Serum: 103 mg/dL (ref 87–352)
IgE (Immunoglobulin E), Serum: 12 IU/mL (ref 6–495)
IgG (Immunoglobin G), Serum: 793 mg/dL (ref 586–1602)
IgM (Immunoglobulin M), Srm: 100 mg/dL (ref 26–217)

## 2021-09-05 LAB — ANA: Anti Nuclear Antibody (ANA): NEGATIVE

## 2021-09-05 LAB — ALPHA-1-ANTITRYPSIN: A-1 Antitrypsin: 129 mg/dL (ref 101–187)

## 2021-09-05 LAB — HEPATITIS B SURFACE ANTIBODY,QUALITATIVE: Hep B Surface Ab, Qual: REACTIVE

## 2021-09-05 LAB — HEPATITIS B CORE ANTIBODY, TOTAL: Hep B Core Total Ab: NEGATIVE

## 2021-09-05 LAB — MITOCHONDRIAL/SMOOTH MUSCLE AB PNL
Mitochondrial Ab: <20 Units (ref 0.0–20.0)
Smooth Muscle Ab: 5 Units (ref 0–19)

## 2021-09-05 LAB — CERULOPLASMIN: Ceruloplasmin: 21.5 mg/dL (ref 19.0–39.0)

## 2021-09-05 LAB — IRON,TIBC AND FERRITIN PANEL
Ferritin: 35 ng/mL (ref 15–150)
Iron Saturation: 15 % (ref 15–55)
Iron: 62 ug/dL (ref 27–159)
Total Iron Binding Capacity: 414 ug/dL (ref 250–450)
UIBC: 352 ug/dL (ref 131–425)

## 2021-09-05 LAB — GAMMA GT: GGT: 31 IU/L (ref 0–60)

## 2021-09-05 LAB — CK: Total CK: 77 U/L (ref 32–182)

## 2021-09-05 LAB — HEPATITIS C ANTIBODY: Hep C Virus Ab: NONREACTIVE

## 2021-09-05 LAB — HEPATITIS B E ANTIBODY: Hep B E Ab: NEGATIVE

## 2021-09-05 LAB — HIV ANTIBODY (ROUTINE TESTING W REFLEX): HIV Screen 4th Generation wRfx: NONREACTIVE

## 2021-09-05 LAB — HEPATITIS A ANTIBODY, TOTAL: hep A Total Ab: NEGATIVE

## 2021-09-05 LAB — ANTI-MICROSOMAL ANTIBODY LIVER / KIDNEY: LKM1 Ab: 0.9 Units (ref 0.0–20.0)

## 2021-09-05 LAB — HEPATITIS B SURFACE ANTIGEN: Hepatitis B Surface Ag: NEGATIVE

## 2021-09-12 ENCOUNTER — Other Ambulatory Visit (HOSPITAL_COMMUNITY): Payer: Self-pay

## 2021-09-12 ENCOUNTER — Other Ambulatory Visit: Payer: Self-pay

## 2021-09-13 ENCOUNTER — Other Ambulatory Visit (HOSPITAL_COMMUNITY): Payer: Self-pay

## 2021-09-13 MED ORDER — RYBELSUS 14 MG PO TABS
ORAL_TABLET | ORAL | 3 refills | Status: DC
  Filled 2021-09-13 – 2021-09-26 (×2): qty 90, 90d supply, fill #0
  Filled 2021-12-13: qty 90, 90d supply, fill #1
  Filled 2022-03-21: qty 90, 90d supply, fill #2
  Filled 2022-06-08: qty 90, 90d supply, fill #3

## 2021-09-23 ENCOUNTER — Other Ambulatory Visit (HOSPITAL_COMMUNITY): Payer: Self-pay

## 2021-09-26 ENCOUNTER — Other Ambulatory Visit: Payer: Self-pay

## 2021-09-27 ENCOUNTER — Other Ambulatory Visit: Payer: Self-pay

## 2021-09-28 ENCOUNTER — Other Ambulatory Visit: Payer: Self-pay

## 2021-09-28 ENCOUNTER — Telehealth: Payer: 59 | Admitting: Gastroenterology

## 2021-09-28 DIAGNOSIS — K760 Fatty (change of) liver, not elsewhere classified: Secondary | ICD-10-CM | POA: Diagnosis not present

## 2021-09-28 MED ORDER — LISINOPRIL-HYDROCHLOROTHIAZIDE 20-12.5 MG PO TABS
1.0000 | ORAL_TABLET | Freq: Every day | ORAL | 3 refills | Status: DC
  Filled 2021-09-28: qty 90, 90d supply, fill #0
  Filled 2021-12-27: qty 90, 90d supply, fill #1
  Filled 2022-03-26: qty 90, 90d supply, fill #2
  Filled 2022-06-26: qty 90, 90d supply, fill #3

## 2021-09-28 NOTE — Progress Notes (Signed)
Tracy Burgess , MD [address]  [place]  [place], [place] [address]  Main: [phone]  Fax: [phone]   Primary Care Physician: Danelle Berry, NP  Virtual Visit via Video Note  I connected with patient on 09/28/21 at  2:15 PM EDT by video and verified that I am speaking with the correct person using two identifiers.   I discussed the limitations, risks, security and privacy concerns of performing an evaluation and management service by video  and the availability of in person appointments. I also discussed with the patient that there may be a patient responsible charge related to this service. The patient expressed understanding and agreed to proceed.  Location of Patient: Home Location of Provider: Home Persons involved: Patient and provider only   History of Present Illness: Chief Complaint  Patient presents with    elevated LFTs    HPI: Tracy Burgess is a 56 y.o. 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  She had labs which she showed me which demonstrated mild elevation in ALT with normal AST as well as alkaline phosphatase and total bilirubin.  No joint pains.  No excess alcohol consumption, no tattoos, no high risk behavior, no incarceration, no illegal drug use.  She did have fatty infiltration suggestive of steatosis on ultrasound of the liver..    Interval history  08/28/2021-09/28/2021  08/31/2021 :, GT normal, hepatitis A total antibody negative, hep B, hep C serology negative she is immune to hepatitis B.  HIV negative, autoimmune screen negative ceruloplasmin normal celiac serology negative CK normal 08/22/2021: Mildly increased echogenicity of the liver parenchyma which could represent mild hepatic steatosis and/or other hepatocellular disease.  No new complaints  Current Outpatient Medications  Medication Sig  Dispense Refill   Blood Glucose Monitoring Suppl (FREESTYLE LITE) DEVI freestyle lite meter     Cholecalciferol (VITAMIN D) 50 MCG (2000 UT) CAPS      dapagliflozin propanediol (FARXIGA) 10 MG TABS tablet Take 10 mg by mouth daily.     dapagliflozin propanediol (FARXIGA) 10 MG TABS tablet TAKE 1 TABLET BY MOUTH DAILY 90 tablet 1   estradiol (ESTRACE) 0.1 MG/GM vaginal cream estradiol 0.01% (0.1 mg/gram) vaginal cream  Insert 1 g 3 times a week by vaginal route at bedtime for 365 days.     fexofenadine (ALLEGRA) 180 MG tablet Take 1 tablet by mouth daily.     glipiZIDE (GLUCOTROL XL) 10 MG 24 hr tablet glipizide ER 10 mg tablet, extended release 24 hr     glipiZIDE (GLUCOTROL XL) 10 MG 24 hr tablet TAKE 1 TABLET BY MOUTH DAILY IN THE MORNING FOR DIABETES 90 tablet 1   glucose blood test strip FreeStyle Lite Strips     ibuprofen (ADVIL,MOTRIN) 200 MG tablet Take 200 mg by mouth every 6 (six) hours as needed.     icosapent Ethyl (VASCEPA) 1 g capsule Take 2 g by mouth in the morning and at bedtime.     Lancets (FREESTYLE) lancets freestyle lancets     lisinopril-hydrochlorothiazide (PRINZIDE,ZESTORETIC) 20-12.5 MG tablet Take 1 tablet by mouth daily.   5   lisinopril-hydrochlorothiazide (ZESTORETIC) 20-12.5 MG tablet TAKE 1 TABLET BY MOUTH ONCE A DAY 90 tablet 3   rosuvastatin (CRESTOR) 40 MG tablet Take 40 mg by mouth daily.     rosuvastatin (  CRESTOR) 40 MG tablet TAKE 1 TABLET BY MOUTH DAILY 90 tablet 1   Semaglutide (RYBELSUS) 14 MG TABS Take 1 tablet by mouth daily in AM 30 min prior to eating with a small sip of water 90 tablet 3   Semaglutide (RYBELSUS) 7 MG TABS Take by mouth.     VASCEPA 1 g capsule TAKE 2 CAPSULES BY MOUTH EVERY MORNING AND 2 CAPSULES EVERY EVENING 360 capsule 3   venlafaxine (EFFEXOR) 37.5 MG tablet      venlafaxine XR (EFFEXOR-XR) 150 MG 24 hr capsule Take 150 mg by mouth daily with breakfast.   5   venlafaxine XR (EFFEXOR-XR) 150 MG 24 hr capsule TAKE 1 CAPSULE BY  MOUTH DAILY IN ADDITION TO 37.5 MG FOR TOTAL OF 187.5 MG DAILY 90 capsule 3   venlafaxine XR (EFFEXOR-XR) 37.5 MG 24 hr capsule TAKE 1 CAPSULE BY MOUTH DAILY IN ADDITION TO 150 MG ER FOR A TOTAL OF 187.5 MG DAILY 90 capsule 3   icosapent Ethyl (VASCEPA) 1 g capsule TAKE 2 CAPSULES BY MOUTH EVERY MORNING AND 2 CAPSULES EVERY EVENING 360 capsule 1   No current facility-administered medications for this visit.    Allergies as of 09/28/2021 - Review Complete 09/28/2021  Allergen Reaction Noted   Sulfa antibiotics Other (See Comments) 09/28/2021    Review of Systems:    All systems reviewed and negative except where noted in HPI.  General Appearance:    Alert, cooperative, no distress, appears stated age  Head:    Normocephalic, without obvious abnormality, atraumatic  Eyes:    PERRL, conjunctiva/corneas clear,  Ears:    Grossly normal hearing    Neurologic:  Grossly normal    Observations/Objective:  Labs: CMP  No results found for: NA, K, CL, CO2, GLUCOSE, BUN, CREATININE, CALCIUM, PROT, ALBUMIN, AST, ALT, ALKPHOS, BILITOT, GFRNONAA, GFRAA No results found for: WBC, HGB, HCT, MCV, PLT  Imaging Studies: No results found.  Assessment and Plan:   Tracy Burgess is a 56 y.o. y/o female here to follow up for isolated elevation of ALT.  Hepatic steatosis seen on ultrasound.  Likely nonalcoholic fatty liver disease ultrasound showed features of hepatic steatosis only Discussed about conservative management including weight loss, healthy eating.  There is a new drug which may be coming out later in the year.  We could discuss this probably in 8 months to 1 year if it would be indicated but nothing more needs to be done at this point of time.  Would recommend addressing cardiovascular risk factors screening for them effectively with her primary care provider such as hypertension, diabetes.    I discussed the assessment and treatment plan with the patient. The patient was provided an  opportunity to ask questions and all were answered. The patient agreed with the plan and demonstrated an understanding of the instructions.   The patient was advised to call back or seek an in-person evaluation if the symptoms worsen or if the condition fails to improve as anticipated.  I provided 12 minutes of face-to-face time during this encounter.  Dr Tracy Bellows MD,MRCP [facility]) Gastroenterology/Hepatology Pager: [phone]   Speech recognition software was used to dictate this note.

## 2021-10-25 ENCOUNTER — Other Ambulatory Visit: Payer: Self-pay

## 2021-10-26 ENCOUNTER — Other Ambulatory Visit: Payer: Self-pay

## 2021-10-26 MED ORDER — GLIPIZIDE ER 10 MG PO TB24
ORAL_TABLET | ORAL | 3 refills | Status: DC
  Filled 2021-10-26: qty 90, 90d supply, fill #0
  Filled 2022-01-26: qty 90, 90d supply, fill #1
  Filled 2022-04-27 (×2): qty 90, 90d supply, fill #2
  Filled 2022-06-26 – 2022-07-26 (×2): qty 90, 90d supply, fill #3

## 2021-10-26 MED ORDER — ROSUVASTATIN CALCIUM 40 MG PO TABS
40.0000 mg | ORAL_TABLET | Freq: Every day | ORAL | 3 refills | Status: DC
  Filled 2021-10-26: qty 90, 90d supply, fill #0
  Filled 2022-01-26: qty 90, 90d supply, fill #1
  Filled 2022-04-27 (×2): qty 90, 90d supply, fill #2
  Filled 2022-07-26: qty 90, 90d supply, fill #3

## 2021-11-01 ENCOUNTER — Other Ambulatory Visit: Payer: Self-pay

## 2021-12-13 ENCOUNTER — Other Ambulatory Visit: Payer: Self-pay

## 2021-12-13 MED ORDER — VENLAFAXINE HCL ER 150 MG PO CP24
ORAL_CAPSULE | ORAL | 3 refills | Status: DC
  Filled 2021-12-13: qty 90, 90d supply, fill #0
  Filled 2022-03-14: qty 90, 90d supply, fill #1
  Filled 2022-06-08: qty 90, 90d supply, fill #2
  Filled 2022-09-05: qty 90, 90d supply, fill #3

## 2021-12-13 MED ORDER — VENLAFAXINE HCL ER 37.5 MG PO CP24
ORAL_CAPSULE | ORAL | 3 refills | Status: DC
  Filled 2021-12-13: qty 90, 90d supply, fill #0
  Filled 2022-03-14: qty 90, 90d supply, fill #1
  Filled 2022-06-08: qty 90, 90d supply, fill #2
  Filled 2022-09-05: qty 90, 90d supply, fill #3

## 2021-12-27 ENCOUNTER — Other Ambulatory Visit: Payer: Self-pay

## 2021-12-29 DIAGNOSIS — E1165 Type 2 diabetes mellitus with hyperglycemia: Secondary | ICD-10-CM | POA: Diagnosis not present

## 2021-12-29 DIAGNOSIS — I10 Essential (primary) hypertension: Secondary | ICD-10-CM | POA: Diagnosis not present

## 2021-12-29 DIAGNOSIS — E785 Hyperlipidemia, unspecified: Secondary | ICD-10-CM | POA: Diagnosis not present

## 2022-01-04 ENCOUNTER — Other Ambulatory Visit: Payer: Self-pay

## 2022-01-04 DIAGNOSIS — E1165 Type 2 diabetes mellitus with hyperglycemia: Secondary | ICD-10-CM | POA: Diagnosis not present

## 2022-01-04 DIAGNOSIS — E785 Hyperlipidemia, unspecified: Secondary | ICD-10-CM | POA: Diagnosis not present

## 2022-01-04 DIAGNOSIS — F411 Generalized anxiety disorder: Secondary | ICD-10-CM | POA: Diagnosis not present

## 2022-01-04 DIAGNOSIS — I10 Essential (primary) hypertension: Secondary | ICD-10-CM | POA: Diagnosis not present

## 2022-01-04 MED ORDER — PAROXETINE HCL 10 MG PO TABS
10.0000 mg | ORAL_TABLET | Freq: Every day | ORAL | 0 refills | Status: DC
  Filled 2022-01-04: qty 30, 30d supply, fill #0

## 2022-01-26 ENCOUNTER — Other Ambulatory Visit: Payer: Self-pay

## 2022-01-26 MED ORDER — PAROXETINE HCL 10 MG PO TABS
10.0000 mg | ORAL_TABLET | Freq: Every day | ORAL | 1 refills | Status: DC
  Filled 2022-01-26: qty 90, 90d supply, fill #0
  Filled 2022-04-27 (×2): qty 90, 90d supply, fill #1

## 2022-01-26 MED ORDER — VASCEPA 1 G PO CAPS
2.0000 g | ORAL_CAPSULE | Freq: Two times a day (BID) | ORAL | 3 refills | Status: DC
  Filled 2022-01-26: qty 360, 90d supply, fill #0
  Filled 2022-07-26: qty 360, 90d supply, fill #1
  Filled 2022-10-24: qty 360, 90d supply, fill #2

## 2022-01-26 MED ORDER — FARXIGA 10 MG PO TABS
10.0000 mg | ORAL_TABLET | Freq: Every day | ORAL | 1 refills | Status: DC
  Filled 2022-01-26: qty 90, 90d supply, fill #0
  Filled 2022-04-27 (×2): qty 90, 90d supply, fill #1

## 2022-01-29 ENCOUNTER — Other Ambulatory Visit: Payer: Self-pay

## 2022-01-31 ENCOUNTER — Other Ambulatory Visit: Payer: Self-pay

## 2022-02-07 ENCOUNTER — Telehealth: Payer: 59 | Admitting: Family

## 2022-02-07 DIAGNOSIS — R399 Unspecified symptoms and signs involving the genitourinary system: Secondary | ICD-10-CM

## 2022-02-07 MED ORDER — CEPHALEXIN 500 MG PO CAPS: 500.0000 mg | ORAL_CAPSULE | Freq: Two times a day (BID) | ORAL | 0 refills | Status: DC

## 2022-02-07 NOTE — Progress Notes (Signed)

## 2022-02-22 ENCOUNTER — Other Ambulatory Visit: Payer: Self-pay

## 2022-02-22 DIAGNOSIS — Z86010 Personal history of colonic polyps: Secondary | ICD-10-CM

## 2022-02-22 MED ORDER — NA SULFATE-K SULFATE-MG SULF 17.5-3.13-1.6 GM/177ML PO SOLN
354.0000 mL | Freq: Once | ORAL | 0 refills | Status: AC
  Filled 2022-02-22: qty 354, 1d supply, fill #0

## 2022-03-05 ENCOUNTER — Encounter: Payer: Self-pay | Admitting: Gastroenterology

## 2022-03-06 ENCOUNTER — Encounter: Payer: Self-pay | Admitting: Gastroenterology

## 2022-03-06 ENCOUNTER — Ambulatory Visit: Payer: 59 | Admitting: Certified Registered Nurse Anesthetist

## 2022-03-06 ENCOUNTER — Encounter
Admission: RE | Disposition: A | Discharge status: Home / Self Care | Payer: Self-pay | Source: Ambulatory Visit | Attending: Gastroenterology

## 2022-03-06 ENCOUNTER — Ambulatory Visit
Admission: RE | Admit: 2022-03-06 | Discharge: 2022-03-06 | Disposition: A | Discharge status: Home / Self Care | Payer: 59 | Source: Ambulatory Visit | Attending: Gastroenterology | Admitting: Gastroenterology

## 2022-03-06 DIAGNOSIS — D122 Benign neoplasm of ascending colon: Secondary | ICD-10-CM | POA: Diagnosis not present

## 2022-03-06 DIAGNOSIS — K635 Polyp of colon: Secondary | ICD-10-CM | POA: Diagnosis not present

## 2022-03-06 DIAGNOSIS — K640 First degree hemorrhoids: Secondary | ICD-10-CM | POA: Insufficient documentation

## 2022-03-06 DIAGNOSIS — D126 Benign neoplasm of colon, unspecified: Secondary | ICD-10-CM

## 2022-03-06 DIAGNOSIS — D120 Benign neoplasm of cecum: Secondary | ICD-10-CM | POA: Diagnosis not present

## 2022-03-06 DIAGNOSIS — Z86010 Personal history of colonic polyps: Secondary | ICD-10-CM | POA: Diagnosis not present

## 2022-03-06 DIAGNOSIS — K649 Unspecified hemorrhoids: Secondary | ICD-10-CM | POA: Diagnosis not present

## 2022-03-06 DIAGNOSIS — E119 Type 2 diabetes mellitus without complications: Secondary | ICD-10-CM | POA: Insufficient documentation

## 2022-03-06 DIAGNOSIS — I10 Essential (primary) hypertension: Secondary | ICD-10-CM | POA: Diagnosis not present

## 2022-03-06 DIAGNOSIS — Z09 Encounter for follow-up examination after completed treatment for conditions other than malignant neoplasm: Secondary | ICD-10-CM | POA: Diagnosis not present

## 2022-03-06 HISTORY — PX: COLONOSCOPY WITH PROPOFOL: SHX5780

## 2022-03-06 LAB — GLUCOSE, CAPILLARY: Glucose-Capillary: 147 mg/dL — ABNORMAL HIGH (ref 70–99)

## 2022-03-06 SURGERY — COLONOSCOPY WITH PROPOFOL
Anesthesia: General

## 2022-03-06 MED ORDER — LIDOCAINE HCL (CARDIAC) PF 100 MG/5ML IV SOSY
PREFILLED_SYRINGE | INTRAVENOUS | Status: DC | PRN
  Administered 2022-03-06: 100 mg via INTRAVENOUS

## 2022-03-06 MED ORDER — EPHEDRINE SULFATE (PRESSORS) 50 MG/ML IJ SOLN
INTRAMUSCULAR | Status: DC | PRN
  Administered 2022-03-06: 5 mg via INTRAVENOUS
  Administered 2022-03-06 (×2): 10 mg via INTRAVENOUS

## 2022-03-06 MED ORDER — SODIUM CHLORIDE 0.9 % IV SOLN
INTRAVENOUS | Status: DC
  Administered 2022-03-06: 1000 mL via INTRAVENOUS

## 2022-03-06 MED ORDER — PROPOFOL 500 MG/50ML IV EMUL
INTRAVENOUS | Status: DC | PRN
  Administered 2022-03-06: 175 ug/kg/min via INTRAVENOUS

## 2022-03-06 MED ORDER — PROPOFOL 1000 MG/100ML IV EMUL
INTRAVENOUS | Status: AC
  Filled 2022-03-06: qty 600

## 2022-03-06 MED ORDER — PROPOFOL 10 MG/ML IV BOLUS
INTRAVENOUS | Status: DC | PRN
  Administered 2022-03-06: 30 mg via INTRAVENOUS
  Administered 2022-03-06: 20 mg via INTRAVENOUS
  Administered 2022-03-06: 100 mg via INTRAVENOUS

## 2022-03-06 MED ORDER — SIMETHICONE 40 MG/0.6ML PO SUSP
ORAL | Status: DC | PRN
  Administered 2022-03-06: 60 mL

## 2022-03-06 NOTE — Anesthesia Postprocedure Evaluation (Signed)
Anesthesia Post Note  Patient: Tracy Burgess  Procedure(s) Performed: COLONOSCOPY WITH PROPOFOL  Patient location during evaluation: PACU Anesthesia Type: General Level of consciousness: awake and alert Pain management: pain level controlled Vital Signs Assessment: post-procedure vital signs reviewed and stable Respiratory status: spontaneous breathing, nonlabored ventilation, respiratory function stable and patient connected to nasal cannula oxygen Cardiovascular status: blood pressure returned to baseline and stable Postop Assessment: no apparent nausea or vomiting Anesthetic complications: no   No notable events documented.   Last Vitals:  Vitals:   03/06/22 0825 03/06/22 0835  BP: 110/65 102/62  Pulse: 90 79  Resp: 15 (!) 22  Temp:    SpO2: 99% 99%    Last Pain:  Vitals:   03/06/22 0835  TempSrc:   PainSc: 0-No pain                 Molli Barrows

## 2022-03-06 NOTE — Transfer of Care (Signed)
Immediate Anesthesia Transfer of Care Note  Patient: ORAH SONNEN  Procedure(s) Performed: COLONOSCOPY WITH PROPOFOL  Patient Location: Endoscopy Unit  Anesthesia Type:General  Level of Consciousness: drowsy, patient cooperative, and responds to stimulation  Airway & Oxygen Therapy: Patient Spontanous Breathing and Patient connected to face mask oxygen  Post-op Assessment: Report given to RN and Post -op Vital signs reviewed and stable  Post vital signs: Reviewed and stable  Last Vitals:  Vitals Value Taken Time  BP 89/51 03/06/22 0815  Temp 36.4 C 03/06/22 0815  Pulse 90 03/06/22 0815  Resp 14 03/06/22 0815  SpO2 99 % 03/06/22 0815    Last Pain:  Vitals:   03/06/22 0815  TempSrc: Temporal  PainSc: Asleep         Complications: No notable events documented.

## 2022-03-06 NOTE — Anesthesia Procedure Notes (Signed)
Procedure Name: General with mask airway Date/Time: 03/06/2022 7:55 AM  Performed by: Kelton Pillar, CRNAPre-anesthesia Checklist: Patient identified, Emergency Drugs available, Suction available and Patient being monitored Patient Re-evaluated:Patient Re-evaluated prior to induction Oxygen Delivery Method: Simple face mask Induction Type: IV induction Placement Confirmation: positive ETCO2, CO2 detector and breath sounds checked- equal and bilateral Dental Injury: Teeth and Oropharynx as per pre-operative assessment

## 2022-03-06 NOTE — Op Note (Signed)
[facility] Gastroenterology Patient Name: Tracy Burgess Procedure Date: 03/06/2022 7:00 AM MRN: [id number] Account #: [id number] Date of Birth: 1965/07/14 Admit Type: Outpatient Age: 56 Room: [facility] ENDO ROOM 2 Gender: Female Note Status: Finalized Instrument Name: Jasper Riling [id number] Procedure:             Colonoscopy Indications:           Surveillance: Piecemeal removal of large sessile                         adenoma last colonoscopy (< 3 yrs) Providers:             Jonathon Bellows MD, MD Referring MD:          Maryellen Pile (Referring MD) Medicines:             Monitored Anesthesia Care Complications:         No immediate complications. Procedure:             Pre-Anesthesia Assessment:                        - Prior to the procedure, a History and Physical was                         performed, and patient medications, allergies and                         sensitivities were reviewed. The patient's tolerance                         of previous anesthesia was reviewed.                        - The risks and benefits of the procedure and the                         sedation options and risks were discussed with the                         patient. All questions were answered and informed                         consent was obtained.                        - ASA Grade Assessment: II - A patient with mild                         systemic disease.                        After obtaining informed consent, the colonoscope was                         passed under direct vision. Throughout the procedure,                         the patient's blood pressure, pulse, and oxygen                         saturations were  monitored continuously. The                         Colonoscope was introduced through the anus and                         advanced to the the cecum, identified by the                         appendiceal orifice. The colonoscopy was performed                          with ease. The patient tolerated the procedure well.                         The quality of the bowel preparation was excellent.                         The appendiceal orifice was photographed. Findings:      The perianal and digital rectal examinations were normal.      Non-bleeding internal hemorrhoids were found during retroflexion. The       hemorrhoids were medium-sized and Grade I (internal hemorrhoids that do       not prolapse).      A 5 mm polyp was found in the ascending colon. The polyp was sessile.       The polyp was removed with a cold snare. Resection and retrieval were       complete.      A 4 mm polyp was found in the appendiceal orifice. The polyp was       sessile. The polyp was removed with a cold snare. Resection and       retrieval were complete. Polyp was in the periappendicular area      Normal mucosa was found at the appendiceal orifice. Biopsies were taken       with a cold forceps for histology. Bx taken at the appendicular orifice       due to prior history of polypectomy      The exam was otherwise without abnormality on direct and retroflexion       views. Impression:            - Non-bleeding internal hemorrhoids.                        - One 5 mm polyp in the ascending colon, removed with                         a cold snare. Resected and retrieved.                        - One 4 mm polyp at the appendiceal orifice, removed                         with a cold snare. Resected and retrieved.                        - Normal mucosa at the appendiceal orifice. Biopsied.                        -  The examination was otherwise normal on direct and                         retroflexion views. Recommendation:        - Discharge patient to home (with escort).                        - Resume previous diet.                        - Continue present medications.                        - Await pathology results.                        - Repeat colonoscopy in 3  years for surveillance. Procedure Code(s):     --- Professional ---                        [phone], Colonoscopy, flexible; with removal of                         tumor(s), polyp(s), or other lesion(s) by snare                         technique                        [id number], 42, Colonoscopy, flexible; with biopsy, single                         or multiple Diagnosis Code(s):     --- Professional ---                        Z86.010, Personal history of colonic polyps                        D12.2, Benign neoplasm of ascending colon                        D12.1, Benign neoplasm of appendix                        K64.0, First degree hemorrhoids CPT copyright 2022 [employer]. All rights reserved. The codes documented in this report are preliminary and upon coder review may  be revised to meet current compliance requirements. Jonathon Bellows, MD Jonathon Bellows MD, MD 03/06/2022 8:12:28 AM This report has been signed electronically. Number of Addenda: 0 Note Initiated On: 03/06/2022 7:00 AM Scope Withdrawal Time: 0 hours 16 minutes 51 seconds  Total Procedure Duration: 0 hours 21 minutes 4 seconds  Estimated Blood Loss:  Estimated blood loss: none.      [facility]

## 2022-03-06 NOTE — H&P (Signed)
Jonathon Bellows, MD [address] [place], [place], [place], [address] [address], [place], [place], [place], [address] Phone: [phone]  Fax: [phone]  Primary Care Physician:  Danelle Berry, NP   Pre-Procedure History & Physical: HPI:  MADDISEN VOUGHT is a 56 y.o. female is here for an colonoscopy.   Past Medical History:  Diagnosis Date   Allergy    Anxiety    Depression    Diabetes mellitus without complication ([place])    Hyperlipidemia    Hypertension     Past Surgical History:  Procedure Laterality Date   CARPAL TUNNEL RELEASE Bilateral [id number]   CESAREAN SECTION  [id number],2831   COLONOSCOPY N/A 12/16/2020   Procedure: COLONOSCOPY;  Surgeon: Virgel Manifold, MD;  Location: [facility] ENDOSCOPY;  Service: Endoscopy;  Laterality: N/A;   COLONOSCOPY N/A 04/21/2021   Procedure: COLONOSCOPY;  Surgeon: Virgel Manifold, MD;  Location: [facility] ENDOSCOPY;  Service: Endoscopy;  Laterality: N/A;   COLONOSCOPY WITH PROPOFOL N/A 01/05/2020   Procedure: COLONOSCOPY WITH PROPOFOL;  Surgeon: Virgel Manifold, MD;  Location: [facility] ENDOSCOPY;  Service: Endoscopy;  Laterality: N/A;   TONSILLECTOMY  1992   tubes in ears  1971    Prior to Admission medications   Medication Sig Start Date End Date Taking? Authorizing Provider  Blood Glucose Monitoring Suppl (FREESTYLE LITE) DEVI freestyle lite meter   Yes [provider]  Cholecalciferol (VITAMIN D) 50 MCG (2000 UT) CAPS    Yes [provider]  dapagliflozin propanediol (FARXIGA) 10 MG TABS tablet Take 10 mg by mouth daily.   Yes [provider]  estradiol (ESTRACE) 0.1 MG/GM vaginal cream Insert 1 g 3 times a week by vaginal route at bedtime for 365 days. 08/21/21  Yes   estradiol (ESTRACE) 0.1 MG/GM vaginal cream estradiol 0.01% (0.1 mg/gram) vaginal cream  Insert 1 g 3 times a week by vaginal route at bedtime for 365 days. 06/21/21  Yes [provider]  fexofenadine (ALLEGRA) 180 MG tablet  Take 1 tablet by mouth daily.   Yes [provider]  glipiZIDE (GLUCOTROL XL) 10 MG 24 hr tablet glipizide ER 10 mg tablet, extended release 24 hr 05/14/19  Yes [provider]  glipiZIDE (GLUCOTROL XL) 10 MG 24 hr tablet TAKE 1 TABLET BY MOUTH DAILY IN THE MORNING FOR DIABETES 10/26/21  Yes   glucose blood test strip FreeStyle Lite Strips   Yes [provider]  icosapent Ethyl (VASCEPA) 1 g capsule Take 2 g by mouth in the morning and at bedtime. 05/14/19  Yes [provider]  Lancets (FREESTYLE) lancets freestyle lancets   Yes [provider]  lisinopril-hydrochlorothiazide (PRINZIDE,ZESTORETIC) 20-12.5 MG tablet Take 1 tablet by mouth daily.  08/17/15  Yes [provider]  lisinopril-hydrochlorothiazide (ZESTORETIC) 20-12.5 MG tablet TAKE 1 TABLET BY MOUTH ONCE A DAY 09/28/21  Yes   PARoxetine (PAXIL) 10 MG tablet Take 1 tablet by mouth daily 01/26/22  Yes   rosuvastatin (CRESTOR) 40 MG tablet Take 40 mg by mouth daily.   Yes [provider]  Semaglutide (RYBELSUS) 14 MG TABS Take 1 tablet by mouth daily in AM 30 min prior to eating with a small sip of water 09/13/21  Yes   venlafaxine (EFFEXOR) 37.5 MG tablet    Yes [provider]  venlafaxine XR (EFFEXOR-XR) 150 MG 24 hr capsule Take 150 mg by mouth daily with breakfast.  08/31/15  Yes [provider]  venlafaxine XR (EFFEXOR-XR) 150 MG 24 hr capsule  TAKE 1 CAPSULE BY MOUTH DAILY IN ADDITION TO 37.5 MG FOR TOTAL OF 187.5 MG DAILY 12/13/21  Yes   venlafaxine XR (EFFEXOR-XR) 37.5 MG 24 hr capsule TAKE 1 CAPSULE BY MOUTH DAILY IN ADDITION TO 150 MG ER FOR A TOTAL OF 187.5 MG DAILY 12/13/21  Yes   cephALEXin (KEFLEX) 500 MG capsule Take 1 capsule (500 mg total) by mouth 2 (two) times daily. Patient not taking: Reported on 03/06/2022 02/07/22   Evelina Dun A, FNP  dapagliflozin propanediol (FARXIGA) 10 MG TABS tablet TAKE 1 TABLET BY MOUTH DAILY 01/26/22     ibuprofen  (ADVIL,MOTRIN) 200 MG tablet Take 200 mg by mouth every 6 (six) hours as needed.    [provider]  icosapent Ethyl (VASCEPA) 1 g capsule TAKE 2 CAPSULES BY MOUTH EVERY MORNING AND 2 CAPSULES EVERY EVENING 06/23/19 06/22/20  Boswell, Donnel Saxon H, NP  rosuvastatin (CRESTOR) 40 MG tablet TAKE 1 TABLET BY MOUTH DAILY 10/26/21     Semaglutide (RYBELSUS) 7 MG TABS Take by mouth.    [provider]  VASCEPA 1 g capsule TAKE 2 CAPSULES BY MOUTH EVERY MORNING AND 2 CAPSULES EVERY EVENING 09/05/20     VASCEPA 1 g capsule Take 2 capsules (2 g total) by mouth every morning and every evening. 01/26/22       Allergies as of 02/22/2022 - Review Complete 02/07/2022  Allergen Reaction Noted   Sulfa antibiotics Other (See Comments) 09/28/2021    Family History  Problem Relation Age of Onset   Breast cancer Paternal Grandmother 31   Diabetes Father     Social History   Socioeconomic History   Marital status: Single    Spouse name: Not on file   Number of children: Not on file   Years of education: Not on file   Highest education level: Not on file  Occupational History   Not on file  Tobacco Use   Smoking status: Never   Smokeless tobacco: Never  Vaping Use   Vaping Use: Never used  Substance and Sexual Activity   Alcohol use: Yes    Alcohol/week: 0.0 standard drinks of alcohol   Drug use: No   Sexual activity: Not on file  Other Topics Concern   Not on file  Social History Narrative   Not on file   Social Determinants of Health   Financial Resource Strain: Not on file  Food Insecurity: Not on file  Transportation Needs: Not on file  Physical Activity: Not on file  Stress: Not on file  Social Connections: Not on file  Intimate Partner Violence: Not on file    Review of Systems: See HPI, otherwise negative ROS  Physical Exam: BP 128/75   Pulse 77   Temp (!) 96.7 F (35.9 C) (Temporal)   Resp 16   Ht '5\' 3"'$  (1.6 m)   Wt 73.6 kg   LMP 09/09/2017   SpO2 100%    BMI 28.75 kg/m  General:   Alert,  pleasant and cooperative in NAD Head:  Normocephalic and atraumatic. Neck:  Supple; no masses or thyromegaly. Lungs:  Clear throughout to auscultation, normal respiratory effort.    Heart:  +S1, +S2, Regular rate and rhythm, No edema. Abdomen:  Soft, nontender and nondistended. Normal bowel sounds, without guarding, and without rebound.   Neurologic:  Alert and  oriented x4;  grossly normal neurologically.  Impression/Plan: DELORES EDELSTEIN is here for an colonoscopy to be performed for surveillance due to prior history of colon polyps  Risks, benefits, limitations, and alternatives regarding  colonoscopy have been reviewed with the patient.  Questions have been answered.  All parties agreeable.   Jonathon Bellows, MD  03/06/2022, 7:42 AM

## 2022-03-06 NOTE — Anesthesia Preprocedure Evaluation (Signed)
Anesthesia Evaluation  Patient identified by MRN, date of birth, ID band Patient awake    Reviewed: Allergy & Precautions, H&P , NPO status , Patient's Chart, lab work & pertinent test results, reviewed documented beta blocker date and time   Airway Mallampati: II   Neck ROM: full    Dental  (+) Poor Dentition   Pulmonary neg pulmonary ROS   Pulmonary exam normal        Cardiovascular Exercise Tolerance: Good hypertension, On Medications negative cardio ROS Normal cardiovascular exam Rhythm:regular Rate:Normal     Neuro/Psych negative neurological ROS  negative psych ROS   GI/Hepatic negative GI ROS, Neg liver ROS,,,  Endo/Other  negative endocrine ROSdiabetes    Renal/GU negative Renal ROS  negative genitourinary   Musculoskeletal   Abdominal   Peds  Hematology negative hematology ROS (+)   Anesthesia Other Findings Past Medical History: No date: Allergy No date: Anxiety No date: Depression No date: Diabetes mellitus without complication (HCC) No date: Hyperlipidemia No date: Hypertension Past Surgical History: 2004,2005: CARPAL TUNNEL RELEASE; Bilateral 3570,1779: CESAREAN SECTION 12/16/2020: COLONOSCOPY; N/A     Comment:  Procedure: COLONOSCOPY;  Surgeon: Virgel Manifold,               MD;  Location: [facility];  Service: Endoscopy;                Laterality: N/A; 04/21/2021: COLONOSCOPY; N/A     Comment:  Procedure: COLONOSCOPY;  Surgeon: Virgel Manifold,               MD;  Location: [facility];  Service: Endoscopy;                Laterality: N/A; 01/05/2020: COLONOSCOPY WITH PROPOFOL; N/A     Comment:  Procedure: COLONOSCOPY WITH PROPOFOL;  Surgeon:               Virgel Manifold, MD;  Location: [facility]COPY;                Service: Endoscopy;  Laterality: N/A; 1992: TONSILLECTOMY 1971: tubes in ears   Reproductive/Obstetrics negative OB ROS                              Anesthesia Physical Anesthesia Plan  ASA: 2  Anesthesia Plan: General   Post-op Pain Management:    Induction:   PONV Risk Score and Plan:   Airway Management Planned:   Additional Equipment:   Intra-op Plan:   Post-operative Plan:   Informed Consent: I have reviewed the patients History and Physical, chart, labs and discussed the procedure including the risks, benefits and alternatives for the proposed anesthesia with the patient or authorized representative who has indicated his/her understanding and acceptance.     Dental Advisory Given  Plan Discussed with: CRNA  Anesthesia Plan Comments:        Anesthesia Quick Evaluation

## 2022-03-07 ENCOUNTER — Encounter: Payer: Self-pay | Admitting: Gastroenterology

## 2022-03-07 LAB — SURGICAL PATHOLOGY

## 2022-03-14 ENCOUNTER — Other Ambulatory Visit: Payer: Self-pay

## 2022-03-21 ENCOUNTER — Other Ambulatory Visit: Payer: Self-pay

## 2022-03-26 ENCOUNTER — Other Ambulatory Visit: Payer: Self-pay

## 2022-04-27 ENCOUNTER — Other Ambulatory Visit: Payer: Self-pay

## 2022-05-30 ENCOUNTER — Other Ambulatory Visit: Payer: Self-pay | Admitting: Nurse Practitioner

## 2022-05-30 DIAGNOSIS — E119 Type 2 diabetes mellitus without complications: Secondary | ICD-10-CM | POA: Diagnosis not present

## 2022-05-30 DIAGNOSIS — I10 Essential (primary) hypertension: Secondary | ICD-10-CM | POA: Diagnosis not present

## 2022-05-30 DIAGNOSIS — E1165 Type 2 diabetes mellitus with hyperglycemia: Secondary | ICD-10-CM | POA: Diagnosis not present

## 2022-05-30 DIAGNOSIS — E039 Hypothyroidism, unspecified: Secondary | ICD-10-CM | POA: Diagnosis not present

## 2022-05-30 DIAGNOSIS — E785 Hyperlipidemia, unspecified: Secondary | ICD-10-CM | POA: Diagnosis not present

## 2022-05-31 LAB — CBC WITH DIFFERENTIAL/PLATELET
Basophils Absolute: 0 10*3/uL (ref 0.0–0.2)
Basos: 0 %
EOS (ABSOLUTE): 0.2 10*3/uL (ref 0.0–0.4)
Eos: 3 %
Hematocrit: 45.2 % (ref 34.0–46.6)
Hemoglobin: 14.7 g/dL (ref 11.1–15.9)
Immature Grans (Abs): 0 10*3/uL (ref 0.0–0.1)
Immature Granulocytes: 0 %
Lymphocytes Absolute: 2 10*3/uL (ref 0.7–3.1)
Lymphs: 29 %
MCH: 28.3 pg (ref 26.6–33.0)
MCHC: 32.5 g/dL (ref 31.5–35.7)
MCV: 87 fL (ref 79–97)
Monocytes Absolute: 0.6 10*3/uL (ref 0.1–0.9)
Monocytes: 8 %
Neutrophils Absolute: 4 10*3/uL (ref 1.4–7.0)
Neutrophils: 60 %
Platelets: 308 10*3/uL (ref 150–450)
RBC: 5.2 x10E6/uL (ref 3.77–5.28)
RDW: 13.2 % (ref 11.7–15.4)
WBC: 6.9 10*3/uL (ref 3.4–10.8)

## 2022-05-31 LAB — COMPREHENSIVE METABOLIC PANEL
ALT: 53 IU/L — ABNORMAL HIGH (ref 0–32)
AST: 26 IU/L (ref 0–40)
Albumin/Globulin Ratio: 2.2 (ref 1.2–2.2)
Albumin: 4.7 g/dL (ref 3.8–4.9)
Alkaline Phosphatase: 53 IU/L (ref 44–121)
BUN/Creatinine Ratio: 27 — ABNORMAL HIGH (ref 9–23)
BUN: 18 mg/dL (ref 6–24)
Bilirubin Total: 0.6 mg/dL (ref 0.0–1.2)
CO2: 23 mmol/L (ref 20–29)
Calcium: 9.3 mg/dL (ref 8.7–10.2)
Chloride: 102 mmol/L (ref 96–106)
Creatinine, Ser: 0.66 mg/dL (ref 0.57–1.00)
Globulin, Total: 2.1 g/dL (ref 1.5–4.5)
Glucose: 153 mg/dL — ABNORMAL HIGH (ref 70–99)
Potassium: 3.9 mmol/L (ref 3.5–5.2)
Sodium: 141 mmol/L (ref 134–144)
Total Protein: 6.8 g/dL (ref 6.0–8.5)
eGFR: 103 mL/min/{1.73_m2} (ref >59)

## 2022-05-31 LAB — HGB A1C W/O EAG: Hgb A1c MFr Bld: 7.6 % — ABNORMAL HIGH (ref 4.8–5.6)

## 2022-05-31 LAB — LIPID PANEL W/O CHOL/HDL RATIO
Cholesterol, Total: 175 mg/dL (ref 100–199)
HDL: 43 mg/dL (ref >39)
LDL Chol Calc (NIH): 106 mg/dL — ABNORMAL HIGH (ref 0–99)
Triglycerides: 146 mg/dL (ref 0–149)
VLDL Cholesterol Cal: 26 mg/dL (ref 5–40)

## 2022-05-31 LAB — TSH: TSH: 2.7 u[IU]/mL (ref 0.450–4.500)

## 2022-06-08 ENCOUNTER — Other Ambulatory Visit: Payer: Self-pay

## 2022-06-08 MED ORDER — FREESTYLE LIBRE 2 SENSOR MISC
2.0000 [IU] | 3 refills | Status: DC
  Filled 2022-06-08: qty 6, 84d supply, fill #0
  Filled 2023-01-21: qty 6, 84d supply, fill #1

## 2022-06-11 ENCOUNTER — Other Ambulatory Visit: Payer: Self-pay

## 2022-06-26 ENCOUNTER — Other Ambulatory Visit: Payer: Self-pay

## 2022-06-29 ENCOUNTER — Other Ambulatory Visit: Payer: Self-pay

## 2022-07-26 ENCOUNTER — Other Ambulatory Visit: Payer: Self-pay | Admitting: Nurse Practitioner

## 2022-07-26 ENCOUNTER — Other Ambulatory Visit: Payer: Self-pay

## 2022-07-26 MED FILL — Paroxetine HCl Tab 10 MG: ORAL | 90 days supply | Qty: 90 | Fill #0 | Status: AC

## 2022-07-26 MED FILL — Dapagliflozin Propanediol Tab 10 MG (Base Equivalent): ORAL | 90 days supply | Qty: 90 | Fill #0 | Status: AC

## 2022-07-27 ENCOUNTER — Other Ambulatory Visit: Payer: Self-pay

## 2022-09-03 ENCOUNTER — Other Ambulatory Visit: Payer: Self-pay

## 2022-09-03 DIAGNOSIS — Z1151 Encounter for screening for human papillomavirus (HPV): Secondary | ICD-10-CM | POA: Diagnosis not present

## 2022-09-03 DIAGNOSIS — Z01419 Encounter for gynecological examination (general) (routine) without abnormal findings: Secondary | ICD-10-CM | POA: Diagnosis not present

## 2022-09-03 DIAGNOSIS — Z79890 Hormone replacement therapy (postmenopausal): Secondary | ICD-10-CM | POA: Diagnosis not present

## 2022-09-03 DIAGNOSIS — Z1231 Encounter for screening mammogram for malignant neoplasm of breast: Secondary | ICD-10-CM | POA: Diagnosis not present

## 2022-09-03 DIAGNOSIS — Z130 Encounter for screening for diseases of the blood and blood-forming organs and certain disorders involving the immune mechanism: Secondary | ICD-10-CM | POA: Diagnosis not present

## 2022-09-03 DIAGNOSIS — Z1389 Encounter for screening for other disorder: Secondary | ICD-10-CM | POA: Diagnosis not present

## 2022-09-03 DIAGNOSIS — Z124 Encounter for screening for malignant neoplasm of cervix: Secondary | ICD-10-CM | POA: Diagnosis not present

## 2022-09-03 MED ORDER — ESTRADIOL 0.1 MG/GM VA CREA
TOPICAL_CREAM | VAGINAL | 5 refills | Status: AC
  Filled 2022-09-03: qty 42.5, 90d supply, fill #0
  Filled 2022-12-21 (×2): qty 42.5, 90d supply, fill #1
  Filled 2023-03-20: qty 42.5, 90d supply, fill #2
  Filled 2023-06-18: qty 42.5, 90d supply, fill #3

## 2022-09-04 DIAGNOSIS — Z124 Encounter for screening for malignant neoplasm of cervix: Secondary | ICD-10-CM | POA: Diagnosis not present

## 2022-09-04 DIAGNOSIS — Z1151 Encounter for screening for human papillomavirus (HPV): Secondary | ICD-10-CM | POA: Diagnosis not present

## 2022-09-07 ENCOUNTER — Other Ambulatory Visit: Payer: Self-pay

## 2022-09-19 DIAGNOSIS — H524 Presbyopia: Secondary | ICD-10-CM | POA: Diagnosis not present

## 2022-09-19 DIAGNOSIS — E119 Type 2 diabetes mellitus without complications: Secondary | ICD-10-CM | POA: Diagnosis not present

## 2022-09-19 DIAGNOSIS — Z7984 Long term (current) use of oral hypoglycemic drugs: Secondary | ICD-10-CM | POA: Diagnosis not present

## 2022-09-19 DIAGNOSIS — H52203 Unspecified astigmatism, bilateral: Secondary | ICD-10-CM | POA: Diagnosis not present

## 2022-09-19 DIAGNOSIS — H5203 Hypermetropia, bilateral: Secondary | ICD-10-CM | POA: Diagnosis not present

## 2022-09-20 ENCOUNTER — Other Ambulatory Visit: Payer: Self-pay

## 2022-09-20 ENCOUNTER — Other Ambulatory Visit: Payer: Self-pay | Admitting: Nurse Practitioner

## 2022-09-20 MED FILL — Lisinopril & Hydrochlorothiazide Tab 20-12.5 MG: ORAL | 90 days supply | Qty: 90 | Fill #0 | Status: AC

## 2022-09-20 MED FILL — Semaglutide Tab 14 MG: ORAL | 90 days supply | Qty: 90 | Fill #0 | Status: AC

## 2022-09-24 ENCOUNTER — Other Ambulatory Visit: Payer: Self-pay

## 2022-10-18 ENCOUNTER — Other Ambulatory Visit: Payer: Commercial Managed Care - PPO

## 2022-10-18 DIAGNOSIS — E119 Type 2 diabetes mellitus without complications: Secondary | ICD-10-CM

## 2022-10-18 DIAGNOSIS — E039 Hypothyroidism, unspecified: Secondary | ICD-10-CM | POA: Diagnosis not present

## 2022-10-18 DIAGNOSIS — I10 Essential (primary) hypertension: Secondary | ICD-10-CM | POA: Diagnosis not present

## 2022-10-18 DIAGNOSIS — E782 Mixed hyperlipidemia: Secondary | ICD-10-CM | POA: Diagnosis not present

## 2022-10-19 LAB — CMP14+EGFR
ALT: 57 IU/L — ABNORMAL HIGH (ref 0–32)
AST: 34 IU/L (ref 0–40)
Albumin: 4.8 g/dL (ref 3.8–4.9)
Alkaline Phosphatase: 58 IU/L (ref 44–121)
BUN/Creatinine Ratio: 16 (ref 9–23)
BUN: 12 mg/dL (ref 6–24)
Bilirubin Total: 0.5 mg/dL (ref 0.0–1.2)
CO2: 22 mmol/L (ref 20–29)
Calcium: 9.9 mg/dL (ref 8.7–10.2)
Chloride: 99 mmol/L (ref 96–106)
Creatinine, Ser: 0.73 mg/dL (ref 0.57–1.00)
Globulin, Total: 2.2 g/dL (ref 1.5–4.5)
Glucose: 118 mg/dL — ABNORMAL HIGH (ref 70–99)
Potassium: 3.8 mmol/L (ref 3.5–5.2)
Sodium: 139 mmol/L (ref 134–144)
Total Protein: 7 g/dL (ref 6.0–8.5)
eGFR: 96 mL/min/{1.73_m2} (ref >59)

## 2022-10-19 LAB — LIPID PANEL
Chol/HDL Ratio: 3.3 ratio (ref 0.0–4.4)
Cholesterol, Total: 139 mg/dL (ref 100–199)
HDL: 42 mg/dL (ref >39)
LDL Chol Calc (NIH): 78 mg/dL (ref 0–99)
Triglycerides: 102 mg/dL (ref 0–149)
VLDL Cholesterol Cal: 19 mg/dL (ref 5–40)

## 2022-10-19 LAB — TSH: TSH: 2.65 u[IU]/mL (ref 0.450–4.500)

## 2022-10-19 LAB — HEMOGLOBIN A1C
Est. average glucose Bld gHb Est-mCnc: 154 mg/dL
Hgb A1c MFr Bld: 7 % — ABNORMAL HIGH (ref 4.8–5.6)

## 2022-10-23 ENCOUNTER — Encounter: Payer: Self-pay | Admitting: Family

## 2022-10-23 ENCOUNTER — Ambulatory Visit: Payer: Commercial Managed Care - PPO | Admitting: Family

## 2022-10-23 VITALS — BP 115/80 | HR 83 | Ht 63.0 in | Wt 163.6 lb

## 2022-10-23 DIAGNOSIS — E1165 Type 2 diabetes mellitus with hyperglycemia: Secondary | ICD-10-CM

## 2022-10-23 DIAGNOSIS — I10 Essential (primary) hypertension: Secondary | ICD-10-CM | POA: Diagnosis not present

## 2022-10-23 DIAGNOSIS — E782 Mixed hyperlipidemia: Secondary | ICD-10-CM

## 2022-10-24 ENCOUNTER — Other Ambulatory Visit: Payer: Self-pay

## 2022-10-24 ENCOUNTER — Other Ambulatory Visit: Payer: Self-pay | Admitting: Nurse Practitioner

## 2022-10-24 MED FILL — Glipizide Tab ER 24HR 10 MG: ORAL | 90 days supply | Qty: 90 | Fill #0 | Status: AC

## 2022-10-24 MED FILL — Paroxetine HCl Tab 10 MG: ORAL | 90 days supply | Qty: 90 | Fill #1 | Status: AC

## 2022-10-24 MED FILL — Rosuvastatin Calcium Tab 40 MG: ORAL | 90 days supply | Qty: 90 | Fill #0 | Status: AC

## 2022-10-24 MED FILL — Dapagliflozin Propanediol Tab 10 MG (Base Equivalent): ORAL | 90 days supply | Qty: 90 | Fill #1 | Status: AC

## 2022-10-25 ENCOUNTER — Other Ambulatory Visit: Payer: Self-pay

## 2022-10-27 ENCOUNTER — Encounter: Payer: Self-pay | Admitting: Family

## 2022-10-27 NOTE — Assessment & Plan Note (Signed)
Blood pressure well controlled with current medications.  Continue current therapy.  Will reassess at follow up.  

## 2022-10-27 NOTE — Progress Notes (Signed)
Established Patient Office Visit  Subjective:  Patient ID: Tracy Burgess, female    DOB: June 18, 1965  Age: 57 y.o. MRN: [id number]  Chief Complaint  Patient presents with   Follow-up    3 months w labs    Patient is here today for her 3 months follow up.  She has been feeling well since last appointment.   She does not have additional concerns to discuss today.  Labs were done recently, need to review these today. She needs refills.   I have reviewed her active problem list, medication list, allergies, notes from last encounter, lab results for her appointment today.    No other concerns at this time.   Past Medical History:  Diagnosis Date   Allergy    Anxiety    Depression    Diabetes mellitus without complication (HCC)    Hyperlipidemia    Hypertension     Past Surgical History:  Procedure Laterality Date   CARPAL TUNNEL RELEASE Bilateral [id number]   CESAREAN SECTION  [id number]   COLONOSCOPY N/A 12/16/2020   Procedure: COLONOSCOPY;  Surgeon: Pasty Spillers, MD;  Location: [facility];  Service: Endoscopy;  Laterality: N/A;   COLONOSCOPY N/A 04/21/2021   Procedure: COLONOSCOPY;  Surgeon: Pasty Spillers, MD;  Location: [facility];  Service: Endoscopy;  Laterality: N/A;   COLONOSCOPY WITH PROPOFOL N/A 01/05/2020   Procedure: COLONOSCOPY WITH PROPOFOL;  Surgeon: Pasty Spillers, MD;  Location: [facility];  Service: Endoscopy;  Laterality: N/A;   COLONOSCOPY WITH PROPOFOL N/A 03/06/2022   Procedure: COLONOSCOPY WITH PROPOFOL;  Surgeon: Wyline Mood, MD;  Location: [facility] END[facility]COPY;  Service: Gastroenterology;  Laterality: N/A;   TONSILLECTOMY  1992   tubes in ears  1971    Social History   Socioeconomic History   Marital status: Single    Spouse name: Not on file   Number of children: Not on file   Years of education: Not on file   Highest education level: Not on file  Occupational History   Not on file  Tobacco Use   Smoking status:  Never   Smokeless tobacco: Never  Vaping Use   Vaping Use: Never used  Substance and Sexual Activity   Alcohol use: Yes    Alcohol/week: 0.0 standard drinks of alcohol   Drug use: No   Sexual activity: Not on file  Other Topics Concern   Not on file  Social History Narrative   Not on file   Social Determinants of Health   Financial Resource Strain: Not on file  Food Insecurity: Not on file  Transportation Needs: Not on file  Physical Activity: Not on file  Stress: Not on file  Social Connections: Not on file  Intimate Partner Violence: Not on file    Family History  Problem Relation Age of Onset   Breast cancer Paternal Grandmother 36   Diabetes Father     Allergies  Allergen Reactions   Sulfa Antibiotics Other (See Comments)   Sulfamethoxazole Other (See Comments)    Other reaction(s): Other (See Comments)  A drop in blood sugar  Drops palette count    Review of Systems  All other systems reviewed and are negative.      Objective:   BP 115/80 (BP Location: Right Arm, Patient Position: Sitting)   Pulse 83   Ht 5\' 3"  (1.6 m)   Wt 163 lb 9.6 oz (74.2 kg)   LMP 09/09/2017   SpO2 96%   BMI 28.98 kg/m  Vitals:   10/23/22 1522  BP: 115/80  Pulse: 83  Height: 5\' 3"  (1.6 m)  Weight: 163 lb 9.6 oz (74.2 kg)  SpO2: 96%  BMI (Calculated): 28.99    Physical Exam Vitals and nursing note reviewed.  Constitutional:      Appearance: Normal appearance. She is normal weight.  HENT:     Head: Normocephalic.  Eyes:     Extraocular Movements: Extraocular movements intact.     Conjunctiva/sclera: Conjunctivae normal.     Pupils: Pupils are equal, round, and reactive to light.  Cardiovascular:     Rate and Rhythm: Normal rate.  Pulmonary:     Effort: Pulmonary effort is normal.  Neurological:     General: No focal deficit present.     Mental Status: She is alert and oriented to person, place, and time.  Psychiatric:        Mood and Affect: Mood normal.         Behavior: Behavior normal.        Thought Content: Thought content normal.        Judgment: Judgment normal.      No results found for any visits on 10/23/22.  Recent Results (from the past 2160 hour(s))  Hemoglobin A1c     Status: Abnormal   Collection Time: 10/18/22 11:29 AM  Result Value Ref Range   Hgb A1c MFr Bld 7.0 (H) 4.8 - 5.6 %    Comment:          Prediabetes: 5.7 - 6.4          Diabetes: >6.4          Glycemic control for adults with diabetes: <7.0    Est. average glucose Bld gHb Est-mCnc 154 mg/dL  TSH     Status: None   Collection Time: 10/18/22 11:29 AM  Result Value Ref Range   TSH 2.650 0.450 - 4.500 uIU/mL  CMP14+EGFR     Status: Abnormal   Collection Time: 10/18/22 11:29 AM  Result Value Ref Range   Glucose 118 (H) 70 - 99 mg/dL   BUN 12 6 - 24 mg/dL   Creatinine, Ser 1.61 0.57 - 1.00 mg/dL   eGFR 96 >09 UE/AVW/0.98   BUN/Creatinine Ratio 16 9 - 23   Sodium 139 134 - 144 mmol/L   Potassium 3.8 3.5 - 5.2 mmol/L   Chloride 99 96 - 106 mmol/L   CO2 22 20 - 29 mmol/L   Calcium 9.9 8.7 - 10.2 mg/dL   Total Protein 7.0 6.0 - 8.5 g/dL   Albumin 4.8 3.8 - 4.9 g/dL   Globulin, Total 2.2 1.5 - 4.5 g/dL   Bilirubin Total 0.5 0.0 - 1.2 mg/dL   Alkaline Phosphatase 58 44 - 121 IU/L   AST 34 0 - 40 IU/L   ALT 57 (H) 0 - 32 IU/L  Lipid panel     Status: None   Collection Time: 10/18/22 11:29 AM  Result Value Ref Range   Cholesterol, Total 139 100 - 199 mg/dL   Triglycerides 119 0 - 149 mg/dL   HDL 42 >14 mg/dL   VLDL Cholesterol Cal 19 5 - 40 mg/dL   LDL Chol Calc (NIH) 78 0 - 99 mg/dL   Chol/HDL Ratio 3.3 0.0 - 4.4 ratio    Comment:  T. Chol/HDL Ratio                                             Men  Women                               1/2 Avg.Risk  3.4    3.3                                   Avg.Risk  5.0    4.4                                2X Avg.Risk  9.6    7.1                                3X Avg.Risk 23.4    11.0        Assessment & Plan:   Problem List Items Addressed This Visit       Active Problems   Essential hypertension, benign    Blood pressure well controlled with current medications.  Continue current therapy.  Will reassess at follow up.       Hyperlipidemia    Checking labs today.  Continue current therapy for lipid control. Will modify as needed based on labwork results.       Type 2 diabetes mellitus with hyperglycemia, without long-term current use of insulin (HCC) - Primary    Checking labs today. Will call pt. With results  Continue current diabetes POC, as patient has been well controlled on current regimen.  Will adjust meds if needed based on labs.        Return in about 3 months (around 01/23/2023) for F/U.   Total time spent: 20 minutes  Miki Kins, FNP  10/23/2022   This document may have been prepared by [facility] Voice Recognition software and as such may include unintentional dictation errors.

## 2022-10-27 NOTE — Assessment & Plan Note (Signed)
Checking labs today. Will call pt. With results  Continue current diabetes POC, as patient has been well controlled on current regimen.  Will adjust meds if needed based on labs.  

## 2022-10-27 NOTE — Assessment & Plan Note (Signed)
Checking labs today.  Continue current therapy for lipid control. Will modify as needed based on labwork results.  

## 2022-12-03 ENCOUNTER — Other Ambulatory Visit: Payer: Self-pay

## 2022-12-03 ENCOUNTER — Other Ambulatory Visit: Payer: Self-pay | Admitting: Nurse Practitioner

## 2022-12-04 ENCOUNTER — Other Ambulatory Visit: Payer: Self-pay

## 2022-12-04 MED ORDER — VITAMIN D 50 MCG (2000 UT) PO CAPS
2000.0000 [IU] | ORAL_CAPSULE | ORAL | 3 refills | Status: AC
  Filled 2022-12-04: qty 90, fill #0

## 2022-12-04 MED FILL — Venlafaxine HCl Cap ER 24HR 37.5 MG (Base Equivalent): ORAL | 90 days supply | Qty: 90 | Fill #0 | Status: AC

## 2022-12-04 MED FILL — Venlafaxine HCl Cap ER 24HR 150 MG (Base Equivalent): ORAL | 90 days supply | Qty: 90 | Fill #0 | Status: AC

## 2022-12-06 ENCOUNTER — Other Ambulatory Visit: Payer: Self-pay

## 2022-12-21 ENCOUNTER — Other Ambulatory Visit: Payer: Self-pay

## 2022-12-21 MED FILL — Semaglutide Tab 14 MG: ORAL | 90 days supply | Qty: 90 | Fill #1 | Status: CN

## 2022-12-21 MED FILL — Lisinopril & Hydrochlorothiazide Tab 20-12.5 MG: ORAL | 90 days supply | Qty: 90 | Fill #1 | Status: CN

## 2022-12-21 MED FILL — Semaglutide Tab 14 MG: ORAL | 90 days supply | Qty: 90 | Fill #1 | Status: AC

## 2022-12-21 MED FILL — Lisinopril & Hydrochlorothiazide Tab 20-12.5 MG: ORAL | 90 days supply | Qty: 90 | Fill #1 | Status: AC

## 2023-01-18 ENCOUNTER — Encounter: Payer: Self-pay | Admitting: Family

## 2023-01-21 ENCOUNTER — Other Ambulatory Visit: Payer: Self-pay

## 2023-01-21 MED ORDER — EMPAGLIFLOZIN 10 MG PO TABS
10.0000 mg | ORAL_TABLET | Freq: Every day | ORAL | 3 refills | Status: DC
  Filled 2023-01-21: qty 30, 30d supply, fill #0
  Filled 2023-02-18: qty 30, 30d supply, fill #1
  Filled 2023-03-20: qty 30, 30d supply, fill #2
  Filled 2023-04-18: qty 30, 30d supply, fill #3

## 2023-01-22 ENCOUNTER — Other Ambulatory Visit: Payer: Self-pay

## 2023-01-22 ENCOUNTER — Other Ambulatory Visit: Payer: Self-pay | Admitting: Nurse Practitioner

## 2023-01-23 ENCOUNTER — Other Ambulatory Visit: Payer: Self-pay

## 2023-01-23 MED FILL — Rosuvastatin Calcium Tab 40 MG: ORAL | 90 days supply | Qty: 90 | Fill #1 | Status: AC

## 2023-01-23 MED FILL — Glipizide Tab ER 24HR 10 MG: ORAL | 90 days supply | Qty: 90 | Fill #1 | Status: AC

## 2023-01-24 ENCOUNTER — Other Ambulatory Visit: Payer: Self-pay

## 2023-01-24 MED ORDER — PAROXETINE HCL 10 MG PO TABS
10.0000 mg | ORAL_TABLET | Freq: Every day | ORAL | 1 refills | Status: DC
  Filled 2023-01-24: qty 90, 90d supply, fill #0
  Filled 2023-04-18: qty 90, 90d supply, fill #1

## 2023-02-21 ENCOUNTER — Ambulatory Visit: Payer: Commercial Managed Care - PPO | Admitting: Family

## 2023-03-05 ENCOUNTER — Other Ambulatory Visit: Payer: Self-pay

## 2023-03-05 MED FILL — Venlafaxine HCl Cap ER 24HR 150 MG (Base Equivalent): ORAL | 90 days supply | Qty: 90 | Fill #1 | Status: AC

## 2023-03-05 MED FILL — Venlafaxine HCl Cap ER 24HR 37.5 MG (Base Equivalent): ORAL | 90 days supply | Qty: 90 | Fill #1 | Status: AC

## 2023-03-06 ENCOUNTER — Other Ambulatory Visit: Payer: Self-pay

## 2023-03-07 ENCOUNTER — Other Ambulatory Visit: Payer: Self-pay

## 2023-03-07 MED ORDER — VASCEPA 1 G PO CAPS
2.0000 g | ORAL_CAPSULE | Freq: Two times a day (BID) | ORAL | 3 refills | Status: DC
  Filled 2023-03-07: qty 360, 90d supply, fill #0
  Filled 2023-06-05: qty 360, 90d supply, fill #1
  Filled 2023-08-31: qty 360, 90d supply, fill #2

## 2023-03-07 MED ORDER — RYBELSUS 14 MG PO TABS
14.0000 mg | ORAL_TABLET | Freq: Every day | ORAL | 3 refills | Status: DC
  Filled 2023-03-07: qty 90, 90d supply, fill #0
  Filled 2023-06-19: qty 90, 90d supply, fill #1
  Filled 2023-09-16: qty 90, 90d supply, fill #2

## 2023-03-13 ENCOUNTER — Other Ambulatory Visit: Payer: Commercial Managed Care - PPO

## 2023-03-13 ENCOUNTER — Other Ambulatory Visit: Payer: Self-pay | Admitting: Nurse Practitioner

## 2023-03-13 DIAGNOSIS — I10 Essential (primary) hypertension: Secondary | ICD-10-CM

## 2023-03-13 DIAGNOSIS — E1165 Type 2 diabetes mellitus with hyperglycemia: Secondary | ICD-10-CM | POA: Diagnosis not present

## 2023-03-13 DIAGNOSIS — E782 Mixed hyperlipidemia: Secondary | ICD-10-CM | POA: Diagnosis not present

## 2023-03-14 LAB — LIPID PANEL
Chol/HDL Ratio: 3.8 ratio (ref 0.0–4.4)
Cholesterol, Total: 149 mg/dL (ref 100–199)
HDL: 39 mg/dL — ABNORMAL LOW
LDL Chol Calc (NIH): 86 mg/dL (ref 0–99)
Triglycerides: 135 mg/dL (ref 0–149)
VLDL Cholesterol Cal: 24 mg/dL (ref 5–40)

## 2023-03-14 LAB — CMP14+EGFR
ALT: 32 [IU]/L (ref 0–32)
AST: 21 [IU]/L (ref 0–40)
Albumin: 4.5 g/dL (ref 3.8–4.9)
Alkaline Phosphatase: 50 [IU]/L (ref 44–121)
BUN/Creatinine Ratio: 19 (ref 9–23)
BUN: 13 mg/dL (ref 6–24)
Bilirubin Total: 0.5 mg/dL (ref 0.0–1.2)
CO2: 22 mmol/L (ref 20–29)
Calcium: 9.7 mg/dL (ref 8.7–10.2)
Chloride: 103 mmol/L (ref 96–106)
Creatinine, Ser: 0.67 mg/dL (ref 0.57–1.00)
Globulin, Total: 2 g/dL (ref 1.5–4.5)
Glucose: 83 mg/dL (ref 70–99)
Potassium: 3.7 mmol/L (ref 3.5–5.2)
Sodium: 142 mmol/L (ref 134–144)
Total Protein: 6.5 g/dL (ref 6.0–8.5)
eGFR: 103 mL/min/{1.73_m2}

## 2023-03-14 LAB — HEMOGLOBIN A1C
Est. average glucose Bld gHb Est-mCnc: 151 mg/dL
Hgb A1c MFr Bld: 6.9 % — ABNORMAL HIGH (ref 4.8–5.6)

## 2023-03-14 LAB — TSH: TSH: 3.05 u[IU]/mL (ref 0.450–4.500)

## 2023-03-18 ENCOUNTER — Ambulatory Visit: Payer: Commercial Managed Care - PPO | Admitting: Family

## 2023-03-20 ENCOUNTER — Other Ambulatory Visit: Payer: Self-pay

## 2023-03-20 MED FILL — Lisinopril & Hydrochlorothiazide Tab 20-12.5 MG: ORAL | 90 days supply | Qty: 90 | Fill #2 | Status: AC

## 2023-03-21 ENCOUNTER — Ambulatory Visit: Payer: Commercial Managed Care - PPO | Admitting: Family

## 2023-03-21 ENCOUNTER — Encounter: Payer: Self-pay | Admitting: Family

## 2023-03-21 VITALS — BP 110/80 | HR 82 | Ht 63.0 in | Wt 168.8 lb

## 2023-03-21 DIAGNOSIS — E782 Mixed hyperlipidemia: Secondary | ICD-10-CM

## 2023-03-21 DIAGNOSIS — I10 Essential (primary) hypertension: Secondary | ICD-10-CM

## 2023-03-21 DIAGNOSIS — E1165 Type 2 diabetes mellitus with hyperglycemia: Secondary | ICD-10-CM

## 2023-03-21 DIAGNOSIS — E1121 Type 2 diabetes mellitus with diabetic nephropathy: Secondary | ICD-10-CM | POA: Diagnosis not present

## 2023-03-21 LAB — GLUCOSE, POCT (MANUAL RESULT ENTRY): POC Glucose: 144 mg/dL — AB (ref 70–99)

## 2023-03-21 LAB — POC CREATINE & ALBUMIN,URINE
Albumin/Creatinine Ratio, Urine, POC: >300
Creatinine, POC: 50 mg/dL
Microalbumin Ur, POC: 80 mg/L

## 2023-03-24 ENCOUNTER — Encounter: Payer: Self-pay | Admitting: Family

## 2023-03-24 DIAGNOSIS — E1121 Type 2 diabetes mellitus with diabetic nephropathy: Secondary | ICD-10-CM | POA: Insufficient documentation

## 2023-03-24 MED ORDER — KERENDIA 10 MG PO TABS
10.0000 mg | ORAL_TABLET | Freq: Every day | ORAL | 3 refills | Status: DC
  Filled 2023-03-24 – 2023-04-13 (×3): qty 30, 30d supply, fill #0
  Filled 2023-05-14: qty 30, 30d supply, fill #1
  Filled 2023-06-19: qty 30, 30d supply, fill #2
  Filled 2023-07-17: qty 30, 30d supply, fill #3

## 2023-03-24 NOTE — Assessment & Plan Note (Signed)
Continue current diabetes POC, as patient has been well controlled on current regimen.  Will adjust meds if needed based on labs.

## 2023-03-24 NOTE — Assessment & Plan Note (Signed)
Checking labs today.  Continue current therapy for lipid control. Will modify as needed based on labwork results.  

## 2023-03-24 NOTE — Progress Notes (Signed)
Established Patient Office Visit  Subjective:  Patient ID: Tracy Burgess, female    DOB: Nov 28, 1965  Age: 57 y.o. MRN: [id number]  Chief Complaint  Patient presents with   Follow-up    Diabetes and medication    Patient is here today for her  follow up.  She has been feeling fairly well since last appointment.   She does not have additional concerns to discuss today.  Labs were done recently, so we will review today. She needs refills.   I have reviewed her active problem list, medication list, allergies, health maintenance, notes from last encounter, lab results for her appointment today.      No other concerns at this time.   Past Medical History:  Diagnosis Date   Allergy    Anxiety    Depression    Diabetes mellitus without complication (HCC)    History of colonoscopy with polypectomy    Hyperlipidemia    Hypertension     Past Surgical History:  Procedure Laterality Date   CARPAL TUNNEL RELEASE Bilateral [id number]   CESAREAN SECTION  [id number]   COLONOSCOPY N/A 12/16/2020   Procedure: COLONOSCOPY;  Surgeon: Pasty Spillers, MD;  Location: [facility] ENDOSCOPY;  Service: Endoscopy;  Laterality: N/A;   COLONOSCOPY N/A 04/21/2021   Procedure: COLONOSCOPY;  Surgeon: Pasty Spillers, MD;  Location: [facility] END[facility];  Service: Endoscopy;  Laterality: N/A;   COLONOSCOPY WITH PROPOFOL N/A 01/05/2020   Procedure: COLONOSCOPY WITH PROPOFOL;  Surgeon: Pasty Spillers, MD;  Location: [facility] ENDOSCOPY;  Service: Endoscopy;  Laterality: N/A;   COLONOSCOPY WITH PROPOFOL N/A 03/06/2022   Procedure: COLONOSCOPY WITH PROPOFOL;  Surgeon: Wyline Mood, MD;  Location: [facility] ENDOSCOPY;  Service: Gastroenterology;  Laterality: N/A;   TONSILLECTOMY  1992   tubes in ears  1971    Social History   Socioeconomic History   Marital status: Single    Spouse name: Not on file   Number of children: Not on file   Years of education: Not on file   Highest education level: Not on file   Occupational History   Not on file  Tobacco Use   Smoking status: Never   Smokeless tobacco: Never  Vaping Use   Vaping status: Never Used  Substance and Sexual Activity   Alcohol use: Yes    Alcohol/week: 0.0 standard drinks of alcohol   Drug use: No   Sexual activity: Not on file  Other Topics Concern   Not on file  Social History Narrative   Not on file   Social Determinants of Health   Financial Resource Strain: Not on file  Food Insecurity: Not on file  Transportation Needs: Not on file  Physical Activity: Not on file  Stress: Not on file  Social Connections: Not on file  Intimate Partner Violence: Not on file    Family History  Problem Relation Age of Onset   Breast cancer Paternal Grandmother 74   Diabetes Father     Allergies  Allergen Reactions   Sulfa Antibiotics Other (See Comments)   Sulfamethoxazole Other (See Comments)    Other reaction(s): Other (See Comments)  A drop in blood sugar  Drops palette count    Review of Systems  All other systems reviewed and are negative.      Objective:   BP 110/80   Pulse 82   Ht 5\' 3"  (1.6 m)   Wt 168 lb 12.8 oz (76.6 kg)   LMP 09/09/2017   SpO2 95%   BMI  29.90 kg/m   Vitals:   03/21/23 1523  BP: 110/80  Pulse: 82  Height: 5\' 3"  (1.6 m)  Weight: 168 lb 12.8 oz (76.6 kg)  SpO2: 95%  BMI (Calculated): 29.91    Physical Exam Vitals and nursing note reviewed.  Constitutional:      Appearance: Normal appearance. She is normal weight.  HENT:     Head: Normocephalic.  Eyes:     Extraocular Movements: Extraocular movements intact.     Conjunctiva/sclera: Conjunctivae normal.     Pupils: Pupils are equal, round, and reactive to light.  Cardiovascular:     Rate and Rhythm: Normal rate.  Pulmonary:     Effort: Pulmonary effort is normal.  Neurological:     General: No focal deficit present.     Mental Status: She is alert and oriented to person, place, and time. Mental status is at  baseline.  Psychiatric:        Mood and Affect: Mood normal.        Behavior: Behavior normal.        Thought Content: Thought content normal.        Judgment: Judgment normal.      Results for orders placed or performed in visit on 03/21/23  POCT Glucose (CBG)  Result Value Ref Range   POC Glucose 144 (A) 70 - 99 mg/dl  POC CREATINE & ALBUMIN,URINE  Result Value Ref Range   Microalbumin Ur, POC 80 mg/L   Creatinine, POC 50 mg/dL   Albumin/Creatinine Ratio, Urine, POC >300     Recent Results (from the past 2160 hour(s))  Hemoglobin A1c     Status: Abnormal   Collection Time: 03/13/23  9:41 AM  Result Value Ref Range   Hgb A1c MFr Bld 6.9 (H) 4.8 - 5.6 %    Comment:          Prediabetes: 5.7 - 6.4          Diabetes: >6.4          Glycemic control for adults with diabetes: <7.0    Est. average glucose Bld gHb Est-mCnc 151 mg/dL  TSH     Status: None   Collection Time: 03/13/23  9:41 AM  Result Value Ref Range   TSH 3.050 0.450 - 4.500 uIU/mL  Lipid panel     Status: Abnormal   Collection Time: 03/13/23  9:41 AM  Result Value Ref Range   Cholesterol, Total 149 100 - 199 mg/dL   Triglycerides 098 0 - 149 mg/dL   HDL 39 (L) >11 mg/dL   VLDL Cholesterol Cal 24 5 - 40 mg/dL   LDL Chol Calc (NIH) 86 0 - 99 mg/dL   Chol/HDL Ratio 3.8 0.0 - 4.4 ratio    Comment:                                   T. Chol/HDL Ratio                                             Men  Women                               1/2 Avg.Risk  3.4    3.3  Avg.Risk  5.0    4.4                                2X Avg.Risk  9.6    7.1                                3X Avg.Risk 23.4   11.0   CMP14+EGFR     Status: None   Collection Time: 03/13/23  9:41 AM  Result Value Ref Range   Glucose 83 70 - 99 mg/dL   BUN 13 6 - 24 mg/dL   Creatinine, Ser 6.29 0.57 - 1.00 mg/dL   eGFR 528 >41 LK/GMW/1.02   BUN/Creatinine Ratio 19 9 - 23   Sodium 142 134 - 144 mmol/L   Potassium 3.7  3.5 - 5.2 mmol/L   Chloride 103 96 - 106 mmol/L   CO2 22 20 - 29 mmol/L   Calcium 9.7 8.7 - 10.2 mg/dL   Total Protein 6.5 6.0 - 8.5 g/dL   Albumin 4.5 3.8 - 4.9 g/dL   Globulin, Total 2.0 1.5 - 4.5 g/dL   Bilirubin Total 0.5 0.0 - 1.2 mg/dL   Alkaline Phosphatase 50 44 - 121 IU/L   AST 21 0 - 40 IU/L   ALT 32 0 - 32 IU/L  POCT Glucose (CBG)     Status: Abnormal   Collection Time: 03/21/23  3:30 PM  Result Value Ref Range   POC Glucose 144 (A) 70 - 99 mg/dl  POC CREATINE & ALBUMIN,URINE     Status: Abnormal   Collection Time: 03/21/23  4:07 PM  Result Value Ref Range   Microalbumin Ur, POC 80 mg/L   Creatinine, POC 50 mg/dL   Albumin/Creatinine Ratio, Urine, POC >300        Assessment & Plan:   Problem List Items Addressed This Visit       Active Problems   Essential hypertension, benign    Blood pressure well controlled with current medications.  Continue current therapy.  Will reassess at follow up.        Hyperlipidemia    Checking labs today.  Continue current therapy for lipid control. Will modify as needed based on labwork results.       Type 2 diabetes mellitus with hyperglycemia, without long-term current use of insulin (HCC) - Primary    Continue current diabetes POC, as patient has been well controlled on current regimen.  Will adjust meds if needed based on labs.        Relevant Orders   POCT Glucose (CBG) (Completed)   POC CREATINE & ALBUMIN,URINE (Completed)   Diabetic nephropathy associated with type 2 diabetes mellitus [facility])    Patient given Kerendia samples.  She will let me know if there she has any side effects.        Return in about 4 months (around 07/19/2023) for F/U.   Total time spent: 20 minutes  Miki Kins, FNP  03/21/2023   This document may have been prepared by [facility] Voice Recognition software and as such may include unintentional dictation errors.

## 2023-03-24 NOTE — Assessment & Plan Note (Signed)
Patient given Kerendia samples.  She will let me know if there she has any side effects.

## 2023-03-24 NOTE — Assessment & Plan Note (Signed)
Blood pressure well controlled with current medications.  Continue current therapy.  Will reassess at follow up.  

## 2023-03-25 ENCOUNTER — Other Ambulatory Visit: Payer: Self-pay

## 2023-03-29 ENCOUNTER — Other Ambulatory Visit: Payer: Self-pay

## 2023-04-09 ENCOUNTER — Other Ambulatory Visit: Payer: Self-pay

## 2023-04-13 ENCOUNTER — Other Ambulatory Visit: Payer: Self-pay

## 2023-04-15 ENCOUNTER — Other Ambulatory Visit: Payer: Self-pay

## 2023-04-18 ENCOUNTER — Other Ambulatory Visit: Payer: Self-pay

## 2023-04-29 MED FILL — Rosuvastatin Calcium Tab 40 MG: ORAL | 90 days supply | Qty: 90 | Fill #2 | Status: AC

## 2023-04-29 MED FILL — Glipizide Tab ER 24HR 10 MG: ORAL | 90 days supply | Qty: 90 | Fill #2 | Status: AC

## 2023-05-23 ENCOUNTER — Other Ambulatory Visit: Payer: Self-pay

## 2023-05-23 ENCOUNTER — Other Ambulatory Visit: Payer: Self-pay | Admitting: Family

## 2023-05-23 MED FILL — Empagliflozin Tab 10 MG: ORAL | 30 days supply | Qty: 30 | Fill #0 | Status: AC

## 2023-05-24 ENCOUNTER — Other Ambulatory Visit: Payer: Self-pay

## 2023-06-05 MED FILL — Venlafaxine HCl Cap ER 24HR 150 MG (Base Equivalent): ORAL | 90 days supply | Qty: 90 | Fill #2 | Status: AC

## 2023-06-05 MED FILL — Venlafaxine HCl Cap ER 24HR 37.5 MG (Base Equivalent): ORAL | 90 days supply | Qty: 90 | Fill #2 | Status: AC

## 2023-06-11 ENCOUNTER — Encounter: Payer: Self-pay | Admitting: Internal Medicine

## 2023-06-11 ENCOUNTER — Other Ambulatory Visit: Payer: Self-pay

## 2023-06-11 ENCOUNTER — Ambulatory Visit: Payer: Commercial Managed Care - PPO | Admitting: Internal Medicine

## 2023-06-11 VITALS — BP 110/84 | HR 98 | Temp 98.1°F | Resp 16 | Ht 63.0 in | Wt 164.9 lb

## 2023-06-11 DIAGNOSIS — E1165 Type 2 diabetes mellitus with hyperglycemia: Secondary | ICD-10-CM | POA: Diagnosis not present

## 2023-06-11 DIAGNOSIS — E782 Mixed hyperlipidemia: Secondary | ICD-10-CM

## 2023-06-11 DIAGNOSIS — Z09 Encounter for follow-up examination after completed treatment for conditions other than malignant neoplasm: Secondary | ICD-10-CM

## 2023-06-11 DIAGNOSIS — Z7984 Long term (current) use of oral hypoglycemic drugs: Secondary | ICD-10-CM

## 2023-06-11 DIAGNOSIS — Z23 Encounter for immunization: Secondary | ICD-10-CM | POA: Diagnosis not present

## 2023-06-11 DIAGNOSIS — F339 Major depressive disorder, recurrent, unspecified: Secondary | ICD-10-CM

## 2023-06-11 DIAGNOSIS — I10 Essential (primary) hypertension: Secondary | ICD-10-CM

## 2023-06-11 DIAGNOSIS — Z9109 Other allergy status, other than to drugs and biological substances: Secondary | ICD-10-CM | POA: Insufficient documentation

## 2023-06-11 MED ORDER — FREESTYLE LIBRE 3 PLUS SENSOR MISC
6 refills | Status: DC
  Filled 2023-06-11: qty 2, 30d supply, fill #0
  Filled 2023-07-17: qty 2, 30d supply, fill #1
  Filled 2023-08-21: qty 2, 30d supply, fill #2
  Filled 2023-09-30: qty 2, 30d supply, fill #3
  Filled 2023-11-18: qty 2, 30d supply, fill #4
  Filled 2023-12-31: qty 2, 30d supply, fill #5
  Filled 2024-02-03 – 2024-02-14 (×2): qty 2, 30d supply, fill #6

## 2023-06-11 NOTE — Assessment & Plan Note (Signed)
Stable, symptoms controlled on Claritin.

## 2023-06-11 NOTE — Assessment & Plan Note (Signed)
Blood pressure stable here today, no changes made to medications.

## 2023-06-11 NOTE — Assessment & Plan Note (Signed)
Well controlled, continue statin

## 2023-06-11 NOTE — Assessment & Plan Note (Signed)
Doing well on Effexor 187.5 mg, Paxil 10 mg daily.

## 2023-06-11 NOTE — Assessment & Plan Note (Addendum)
Controlled, A1c in November 6.9%. Having some low blood sugars in the night, discussed potentially decreasing Glipizide but ultimately will continue current medications and recheck in 1 month. Foot exam today. She did have elevated albumin ratio on her microalbumin previously, now on [place] as well. Will order Free style libre 3 plus today.

## 2023-06-11 NOTE — Progress Notes (Signed)
New Patient Office Visit  Subjective    Patient ID: Tracy Burgess, female    DOB: [birth year]  Age: 58 y.o. MRN: [id number]  CC:  Chief Complaint  Patient presents with   Establish Care    HPI Tracy Burgess presents to establish care.  Hypertension: -Medications: Lisinopril-hydrochlorothiazide 20-12.5 mg -Patient is compliant with above medications and reports no side effects. -Checking BP at home (average): not checking regularly  -Denies any SOB, CP, vision changes, LE edema or symptoms of hypotension  HLD: -Medications: Crestor 40 mg, Vascepa 2 g BID -Patient is compliant with above medications and reports no side effects.  -Last lipid panel: Lipid Panel     Component Value Date/Time   CHOL 149 03/13/2023 0941   TRIG 135 03/13/2023 0941   HDL 39 (L) 03/13/2023 0941   CHOLHDL 3.8 03/13/2023 0941   LDLCALC 86 03/13/2023 0941   LABVLDL 24 03/13/2023 0941   Diabetes, Type 2: -Last A1c 6.9% 11/24 -Was first diagnosed in 2012, had history of gestational diabetes 23 years ago -Medications: Jardiance 10 mg, Glipizide 10 mg, Rybelsus 14 mg. Now on Kerendia since microalbumin was elevated -Failed meds: Marcelline Deist due to vaginitis  -Patient is compliant with the above medications and reports no side effects.  -Will occasionally have a low blood sugar in the middle of the night - sensor will wake her up at 69 -Fasting home BG: 90-140 -Post-prandial home BG: 150-200 -Eye exam: UTD 6/24 -Foot exam: Due today -Microalbumin: UTD 11/24 -Statin: yes -PNA vaccine: Due today -Denies symptoms of hypoglycemia, polyuria, polydipsia, numbness extremities, foot ulcers/trauma.   MDD: -Mood status: stable -Current treatment: Effexor 187.5  mg, Paxil 10 mg -Satisfied with current treatment?: yes -Duration of current treatment : years -Side effects: no Medication compliance: excellent compliance     06/11/2023    3:19 PM  Depression screen PHQ 2/9  Decreased Interest 0  Down,  Depressed, Hopeless 0  PHQ - 2 Score 0   Environmental Allergies: -Taking Claritin daily, controlling symptoms  Menopausal Symptoms: -Currently on Estrace vaginal cream prescribed by her Gynecologist   Health Maintenance: -Blood work UTD -Mammogram through Gynecology -Colon cancer screening: colonoscopy 02/2022, repeat in 3 years -Tdap and Prevnar 20 due today    Outpatient Encounter Medications as of 06/11/2023  Medication Sig   Cholecalciferol (VITAMIN D) 50 MCG (2000 UT) CAPS Take 1 capsule (2,000 Units total) by mouth once a week.   Continuous Glucose Sensor (FREESTYLE LIBRE 3 PLUS SENSOR) MISC Change sensor every 15 days.   empagliflozin (JARDIANCE) 10 MG TABS tablet Take 1 tablet (10 mg total) by mouth daily before breakfast.   estradiol (ESTRACE) 0.1 MG/GM vaginal cream Insert 1 g 3 times a week by vaginal route at bedtime for 365 days.   Finerenone (KERENDIA) 10 MG TABS Take 1 tablet (10 mg total) by mouth daily.   glipiZIDE (GLUCOTROL XL) 10 MG 24 hr tablet Take 1 tablet (10 mg total) by mouth every morning for diabetes   ibuprofen (ADVIL,MOTRIN) 200 MG tablet Take 200 mg by mouth every 6 (six) hours as needed.   lisinopril-hydrochlorothiazide (ZESTORETIC) 20-12.5 MG tablet TAKE 1 TABLET BY MOUTH ONCE A DAY   Loratadine (CLARITIN) 10 MG CAPS Take 1 capsule by mouth daily.   PARoxetine (PAXIL) 10 MG tablet Take 1 tablet by mouth daily   rosuvastatin (CRESTOR) 40 MG tablet Take 1 tablet (40 mg total) by mouth daily.   Semaglutide (RYBELSUS) 14 MG TABS Take 1 tablet (14  mg total) by mouth daily  in AM 30 min prior to eating with a small sip of water   VASCEPA 1 g capsule Take 2 capsules (2 g total) by mouth every morning and every evening.   venlafaxine XR (EFFEXOR-XR) 150 MG 24 hr capsule Take 1 capsule (150 mg total) by mouth daily. Take with 37.5mg  for total of 187.5mg  daily.   venlafaxine XR (EFFEXOR-XR) 37.5 MG 24 hr capsule Take 1 capsule (37.5 mg total) by mouth daily.  Take with 150mg  for total of 187.5mg  daily.   [DISCONTINUED] Blood Glucose Monitoring Suppl (FREESTYLE LITE) DEVI freestyle lite meter (Patient not taking: Reported on 06/11/2023)   [DISCONTINUED] Continuous Glucose Sensor (FREESTYLE LIBRE 2 SENSOR) MISC Apply to arm every 2 weeks (Patient not taking: Reported on 06/11/2023)   [DISCONTINUED] glucose blood test strip FreeStyle Lite Strips (Patient not taking: Reported on 06/11/2023)   [DISCONTINUED] Lancets (FREESTYLE) lancets freestyle lancets (Patient not taking: Reported on 06/11/2023)   No facility-administered encounter medications on file as of 06/11/2023.    Past Medical History:  Diagnosis Date   Allergy    Anxiety    Depression    Diabetes mellitus without complication (HCC)    History of colonoscopy with polypectomy    Hyperlipidemia    Hypertension     Past Surgical History:  Procedure Laterality Date   CARPAL TUNNEL RELEASE Bilateral [id number]   CESAREAN SECTION  [id number]   COLONOSCOPY N/A 12/16/2020   Procedure: COLONOSCOPY;  Surgeon: Pasty Spillers, MD;  Location: [facility] ENDOSCOPY;  Service: Endoscopy;  Laterality: N/A;   COLONOSCOPY N/A 04/21/2021   Procedure: COLONOSCOPY;  Surgeon: Pasty Spillers, MD;  Location: [facility] ENDOSCOPY;  Service: Endoscopy;  Laterality: N/A;   COLONOSCOPY WITH PROPOFOL N/A 01/05/2020   Procedure: COLONOSCOPY WITH PROPOFOL;  Surgeon: Pasty Spillers, MD;  Location: [facility] ENDOSCOPY;  Service: Endoscopy;  Laterality: N/A;   COLONOSCOPY WITH PROPOFOL N/A 03/06/2022   Procedure: COLONOSCOPY WITH PROPOFOL;  Surgeon: Wyline Mood, MD;  Location: [facility] ENDOSCOPY;  Service: Gastroenterology;  Laterality: N/A;   TONSILLECTOMY  1992   tubes in ears  1971    Family History  Problem Relation Age of Onset   Breast cancer Paternal Grandmother 63   Diabetes Father     Social History   Socioeconomic History   Marital status: Single    Spouse name: Not on file   Number of children: Not on file    Years of education: Not on file   Highest education level: Bachelor's degree (e.g., BA, AB, BS)  Occupational History   Not on file  Tobacco Use   Smoking status: Never   Smokeless tobacco: Never  Vaping Use   Vaping status: Never Used  Substance and Sexual Activity   Alcohol use: Yes    Alcohol/week: 0.0 standard drinks of alcohol   Drug use: No   Sexual activity: Not on file  Other Topics Concern   Not on file  Social History Narrative   Not on file   Social Drivers of Health   Financial Resource Strain: Low Risk  (06/05/2023)   Overall Financial Resource Strain (CARDIA)    Difficulty of Paying Living Expenses: Not hard at all  Food Insecurity: No Food Insecurity (06/05/2023)   Hunger Vital Sign    Worried About Running Out of Food in the Last Year: Never true    Ran Out of Food in the Last Year: Never true  Transportation Needs: No Transportation Needs (06/05/2023)   PRAPARE -  Administrator, Civil Service (Medical): No    Lack of Transportation (Non-Medical): No  Physical Activity: Insufficiently Active (06/05/2023)   Exercise Vital Sign    Days of Exercise per Week: 2 days    Minutes of Exercise per Session: 40 min  Stress: No Stress Concern Present (06/05/2023)   [employer] of Occupational Health - Occupational Stress Questionnaire    Feeling of Stress : Only a little  Social Connections: Moderately Isolated (06/05/2023)   Social Connection and Isolation Panel [NHANES]    Frequency of Communication with Friends and Family: More than three times a week    Frequency of Social Gatherings with Friends and Family: More than three times a week    Attends Religious Services: 1 to 4 times per year    Active Member of [employer] or Organizations: No    Attends Engineer, structural: Not on file    Marital Status: Divorced  Intimate Partner Violence: Not on file    Review of Systems  All other systems reviewed and are negative.       Objective    BP  110/84 (Cuff Size: Large)   Pulse 98   Temp 98.1 F (36.7 C)   Resp 16   Ht 5\' 3"  (1.6 m)   Wt 164 lb 14.4 oz (74.8 kg)   LMP 09/09/2017   SpO2 98%   BMI 29.21 kg/m   Physical Exam Constitutional:      Appearance: Normal appearance.  HENT:     Head: Normocephalic and atraumatic.     Mouth/Throat:     Mouth: Mucous membranes are moist.     Pharynx: Oropharynx is clear.  Eyes:     Extraocular Movements: Extraocular movements intact.     Conjunctiva/sclera: Conjunctivae normal.     Pupils: Pupils are equal, round, and reactive to light.  Neck:     Comments: No thyromegaly Cardiovascular:     Rate and Rhythm: Normal rate and regular rhythm.     Pulses:          Dorsalis pedis pulses are 2+ on the right side and 2+ on the left side.  Pulmonary:     Effort: Pulmonary effort is normal.     Breath sounds: Normal breath sounds.  Musculoskeletal:     Cervical back: No tenderness.     Right lower leg: No edema.     Left lower leg: No edema.     Right foot: Normal range of motion. No deformity, bunion, Charcot foot, foot drop or prominent metatarsal heads.     Left foot: Normal range of motion. No deformity, bunion, Charcot foot, foot drop or prominent metatarsal heads.  Feet:     Right foot:     Protective Sensation: 6 sites tested.  6 sites sensed.     Skin integrity: Skin integrity normal.     Toenail Condition: Right toenails are normal.     Left foot:     Protective Sensation: 6 sites tested.  6 sites sensed.     Skin integrity: Skin integrity normal.     Toenail Condition: Left toenails are normal.  Lymphadenopathy:     Cervical: No cervical adenopathy.  Skin:    General: Skin is warm and dry.  Neurological:     General: No focal deficit present.     Mental Status: She is alert. Mental status is at baseline.  Psychiatric:        Mood and Affect: Mood normal.  Behavior: Behavior normal.         Assessment & Plan:  Type 2 diabetes mellitus with  hyperglycemia, without long-term current use of insulin [facility]) Assessment & Plan: Controlled, A1c in November 6.9%. Having some low blood sugars in the night, discussed potentially decreasing Glipizide but ultimately will continue current medications and recheck in 1 month. Foot exam today. She did have elevated albumin ratio on her microalbumin previously, now on [place] as well. Will order Free style libre 3 plus today.   Orders: -     FreeStyle Libre 3 Plus Sensor; Change sensor every 15 days.  Dispense: 2 each; Refill: 6 -     HM Diabetes Foot Exam  Essential hypertension, benign Assessment & Plan: Blood pressure stable here today, no changes made to medications.   Mixed hyperlipidemia Assessment & Plan: Well controlled, continue statin.   Environmental allergies Assessment & Plan: Stable, symptoms controlled on Claritin.   Episode of recurrent major depressive disorder, unspecified depression episode severity (HCC) Assessment & Plan: Doing well on Effexor 187.5 mg, Paxil 10 mg daily.    Need for Tdap vaccination -     Tdap vaccine greater than or equal to 7yo IM  Need for immunization follow-up -     Pneumococcal conjugate vaccine 20-valent    Return in about 4 weeks (around 07/09/2023).   Margarita Mail, DO

## 2023-06-18 ENCOUNTER — Other Ambulatory Visit: Payer: Self-pay

## 2023-06-19 MED FILL — Lisinopril & Hydrochlorothiazide Tab 20-12.5 MG: ORAL | 90 days supply | Qty: 90 | Fill #3 | Status: AC

## 2023-06-19 MED FILL — Empagliflozin Tab 10 MG: ORAL | 30 days supply | Qty: 30 | Fill #1 | Status: AC

## 2023-06-30 ENCOUNTER — Other Ambulatory Visit: Payer: Self-pay

## 2023-07-11 ENCOUNTER — Ambulatory Visit: Payer: Commercial Managed Care - PPO | Admitting: Internal Medicine

## 2023-07-16 ENCOUNTER — Ambulatory Visit: Admitting: Internal Medicine

## 2023-07-17 ENCOUNTER — Other Ambulatory Visit: Payer: Self-pay

## 2023-07-17 ENCOUNTER — Encounter: Payer: Self-pay | Admitting: Internal Medicine

## 2023-07-17 ENCOUNTER — Ambulatory Visit: Admitting: Internal Medicine

## 2023-07-17 VITALS — BP 120/74 | HR 85 | Temp 98.4°F | Resp 16 | Ht 63.0 in | Wt 161.5 lb

## 2023-07-17 DIAGNOSIS — E1165 Type 2 diabetes mellitus with hyperglycemia: Secondary | ICD-10-CM

## 2023-07-17 LAB — POCT GLYCOSYLATED HEMOGLOBIN (HGB A1C): Hemoglobin A1C: 6.6 % — AB (ref 4.0–5.6)

## 2023-07-17 MED FILL — Empagliflozin Tab 10 MG: ORAL | 30 days supply | Qty: 30 | Fill #2 | Status: AC

## 2023-07-17 NOTE — Progress Notes (Signed)
 Established Patient Office Visit  Subjective    Patient ID: Tracy Burgess, female    DOB: [birth year]  Age: 58 y.o. MRN: [id number]  CC:  Chief Complaint  Patient presents with   Follow-up    4 week follow up    HPI [name] presents to follow up on chronic medical conditions.   Hypertension: -Medications: Lisinopril-hydrochlorothiazide 20-12.5 mg -Patient is compliant with above medications and reports no side effects. -Checking BP at home (average): not checking regularly  -Denies any SOB, CP, vision changes, LE edema or symptoms of hypotension  HLD: -Medications: Crestor 40 mg, Vascepa 2 g BID -Patient is compliant with above medications and reports no side effects.  -Last lipid panel: Lipid Panel     Component Value Date/Time   CHOL 149 03/13/2023 0941   TRIG 135 03/13/2023 0941   HDL 39 (L) 03/13/2023 0941   CHOLHDL 3.8 03/13/2023 0941   LDLCALC 86 03/13/2023 0941   LABVLDL 24 03/13/2023 0941   Diabetes, Type 2: -Last A1c 6.9% 11/24 -Was first diagnosed in 2012, had history of gestational diabetes 23 years ago -Medications: Jardiance 10 mg, Glipizide 10 mg, Rybelsus 14 mg. Now on Kerendia since microalbumin was elevated -Failed meds: Marcelline Deist due to vaginitis  -Patient is compliant with the above medications and reports no side effects.  -Has Free Style Libre 3  -Will occasionally have a low blood sugar in the middle of the night - sensor will wake her up at 69, had 3 recent low's, yesterday afternoon it was 60 -Fasting home BG: 90-140 -Post-prandial home BG: 150-200 -Eye exam: UTD 6/24 -Foot exam: UTD 2/25 -Microalbumin: UTD 11/24 -Statin: yes -PNA vaccine: UTD -Denies symptoms of hypoglycemia, polyuria, polydipsia, numbness extremities, foot ulcers/trauma.   MDD: -Mood status: stable -Current treatment: Effexor 187.5  mg, Paxil 10 mg -Satisfied with current treatment?: yes -Duration of current treatment : years -Side effects: no Medication  compliance: excellent compliance     06/11/2023    3:19 PM  Depression screen PHQ 2/9  Decreased Interest 0  Down, Depressed, Hopeless 0  PHQ - 2 Score 0   Environmental Allergies: -Taking Claritin daily, controlling symptoms  Menopausal Symptoms: -Currently on Estrace vaginal cream prescribed by her Gynecologist   Health Maintenance: -Blood work UTD -Mammogram through Gynecology -Colon cancer screening: colonoscopy 02/2022, repeat in 3 years -Vaccines UTD   Outpatient Encounter Medications as of 07/17/2023  Medication Sig   Cholecalciferol (VITAMIN D) 50 MCG (2000 UT) CAPS Take 1 capsule (2,000 Units total) by mouth once a week.   empagliflozin (JARDIANCE) 10 MG TABS tablet Take 1 tablet (10 mg total) by mouth daily before breakfast.   estradiol (ESTRACE) 0.1 MG/GM vaginal cream Insert 1 g 3 times a week by vaginal route at bedtime for 365 days.   Finerenone (KERENDIA) 10 MG TABS Take 1 tablet (10 mg total) by mouth daily.   ibuprofen (ADVIL,MOTRIN) 200 MG tablet Take 200 mg by mouth every 6 (six) hours as needed.   lisinopril-hydrochlorothiazide (ZESTORETIC) 20-12.5 MG tablet TAKE 1 TABLET BY MOUTH ONCE A DAY   Loratadine (CLARITIN) 10 MG CAPS Take 1 capsule by mouth daily.   PARoxetine (PAXIL) 10 MG tablet Take 1 tablet by mouth daily   rosuvastatin (CRESTOR) 40 MG tablet Take 1 tablet (40 mg total) by mouth daily.   Semaglutide (RYBELSUS) 14 MG TABS Take 1 tablet (14 mg total) by mouth daily  in AM 30 min prior to eating with a small sip of  water   VASCEPA 1 g capsule Take 2 capsules (2 g total) by mouth every morning and every evening.   venlafaxine XR (EFFEXOR-XR) 150 MG 24 hr capsule Take 1 capsule (150 mg total) by mouth daily. Take with 37.5mg  for total of 187.5mg  daily.   venlafaxine XR (EFFEXOR-XR) 37.5 MG 24 hr capsule Take 1 capsule (37.5 mg total) by mouth daily. Take with 150mg  for total of 187.5mg  daily.   [DISCONTINUED] glipiZIDE (GLUCOTROL XL) 10 MG 24 hr tablet  Take 1 tablet (10 mg total) by mouth every morning for diabetes   Continuous Glucose Sensor (FREESTYLE LIBRE 3 PLUS SENSOR) MISC Change sensor every 15 days. (Patient not taking: Reported on 07/17/2023)   No facility-administered encounter medications on file as of 07/17/2023.    Past Medical History:  Diagnosis Date   Allergy    Anxiety    Depression    Diabetes mellitus without complication (HCC)    History of colonoscopy with polypectomy    Hyperlipidemia    Hypertension     Past Surgical History:  Procedure Laterality Date   CARPAL TUNNEL RELEASE Bilateral [id number]   CESAREAN SECTION  [id number]   COLONOSCOPY N/A 12/16/2020   Procedure: COLONOSCOPY;  Surgeon: Pasty Spillers, MD;  Location: [facility] ENDOSCOPY;  Service: Endoscopy;  Laterality: N/A;   COLONOSCOPY N/A 04/21/2021   Procedure: COLONOSCOPY;  Surgeon: Pasty Spillers, MD;  Location: [facility] ENDOSCOPY;  Service: Endoscopy;  Laterality: N/A;   COLONOSCOPY WITH PROPOFOL N/A 01/05/2020   Procedure: COLONOSCOPY WITH PROPOFOL;  Surgeon: Pasty Spillers, MD;  Location: [facility] ENDOSCOPY;  Service: Endoscopy;  Laterality: N/A;   COLONOSCOPY WITH PROPOFOL N/A 03/06/2022   Procedure: COLONOSCOPY WITH PROPOFOL;  Surgeon: Wyline Mood, MD;  Location: [facility] ENDOSCOPY;  Service: Gastroenterology;  Laterality: N/A;   TONSILLECTOMY  1992   tubes in ears  1971    Family History  Problem Relation Age of Onset   Breast cancer Paternal Grandmother 78   Diabetes Father     Social History   Socioeconomic History   Marital status: Single    Spouse name: Not on file   Number of children: Not on file   Years of education: Not on file   Highest education level: Bachelor's degree (e.g., BA, AB, BS)  Occupational History   Not on file  Tobacco Use   Smoking status: Never   Smokeless tobacco: Never  Vaping Use   Vaping status: Never Used  Substance and Sexual Activity   Alcohol use: Yes    Alcohol/week: 0.0 standard drinks of  alcohol   Drug use: No   Sexual activity: Not on file  Other Topics Concern   Not on file  Social History Narrative   Not on file   Social Drivers of Health   Financial Resource Strain: Low Risk  (06/05/2023)   Overall Financial Resource Strain (CARDIA)    Difficulty of Paying Living Expenses: Not hard at all  Food Insecurity: No Food Insecurity (06/05/2023)   Hunger Vital Sign    Worried About Running Out of Food in the Last Year: Never true    Ran Out of Food in the Last Year: Never true  Transportation Needs: No Transportation Needs (06/05/2023)   PRAPARE - Administrator, Civil Service (Medical): No    Lack of Transportation (Non-Medical): No  Physical Activity: Insufficiently Active (06/05/2023)   Exercise Vital Sign    Days of Exercise per Week: 2 days    Minutes of Exercise per  Session: 40 min  Stress: No Stress Concern Present (06/05/2023)   [employer] of Occupational Health - Occupational Stress Questionnaire    Feeling of Stress : Only a little  Social Connections: Moderately Isolated (06/05/2023)   Social Connection and Isolation Panel [NHANES]    Frequency of Communication with Friends and Family: More than three times a week    Frequency of Social Gatherings with Friends and Family: More than three times a week    Attends Religious Services: 1 to 4 times per year    Active Member of [employer] or Organizations: No    Attends Engineer, structural: Not on file    Marital Status: Divorced  Intimate Partner Violence: Not on file    Review of Systems  All other systems reviewed and are negative.       Objective    BP 120/74 (Cuff Size: Normal)   Pulse 85   Temp 98.4 F (36.9 C) (Oral)   Resp 16   Ht 5\' 3"  (1.6 m)   Wt 161 lb 8 oz (73.3 kg)   LMP 09/09/2017   BMI 28.61 kg/m   Physical Exam Constitutional:      Appearance: Normal appearance.  Eyes:     Conjunctiva/sclera: Conjunctivae normal.  Cardiovascular:     Rate and Rhythm: Normal  rate and regular rhythm.  Pulmonary:     Effort: Pulmonary effort is normal.     Breath sounds: Normal breath sounds.  Skin:    General: Skin is warm and dry.  Neurological:     General: No focal deficit present.     Mental Status: She is alert. Mental status is at baseline.  Psychiatric:        Mood and Affect: Mood normal.        Behavior: Behavior normal.         Assessment & Plan:  Type 2 diabetes mellitus with hyperglycemia, without long-term current use of insulin (HCC) Assessment & Plan: A1c improved to 6.6%, patient is having some low blood sugars, will discontinue Glipizide, continue Jardiance and Rybelsus and recheck in 3 months.   Orders: -     POCT glycosylated hemoglobin (Hb A1C)     Return in about 3 months (around 10/17/2023) for follow up on a1c.   Margarita Mail, DO

## 2023-07-17 NOTE — Assessment & Plan Note (Signed)
 A1c improved to 6.6%, patient is having some low blood sugars, will discontinue Glipizide, continue Jardiance and Rybelsus and recheck in 3 months.

## 2023-07-18 ENCOUNTER — Other Ambulatory Visit: Payer: Self-pay

## 2023-07-19 ENCOUNTER — Ambulatory Visit: Payer: Commercial Managed Care - PPO | Admitting: Family

## 2023-07-24 ENCOUNTER — Other Ambulatory Visit: Payer: Self-pay

## 2023-07-24 ENCOUNTER — Other Ambulatory Visit: Payer: Self-pay | Admitting: Family

## 2023-07-24 MED ORDER — PAROXETINE HCL 10 MG PO TABS
10.0000 mg | ORAL_TABLET | Freq: Every day | ORAL | 1 refills | Status: DC
  Filled 2023-07-24: qty 90, 90d supply, fill #0
  Filled 2023-10-22: qty 90, 90d supply, fill #1

## 2023-07-24 MED FILL — Rosuvastatin Calcium Tab 40 MG: ORAL | 90 days supply | Qty: 90 | Fill #3 | Status: AC

## 2023-08-21 ENCOUNTER — Other Ambulatory Visit: Payer: Self-pay | Admitting: Family

## 2023-08-21 ENCOUNTER — Other Ambulatory Visit: Payer: Self-pay

## 2023-08-21 MED FILL — Empagliflozin Tab 10 MG: ORAL | 30 days supply | Qty: 30 | Fill #3 | Status: AC

## 2023-08-22 ENCOUNTER — Other Ambulatory Visit: Payer: Self-pay

## 2023-08-22 MED ORDER — KERENDIA 10 MG PO TABS
10.0000 mg | ORAL_TABLET | Freq: Every day | ORAL | 3 refills | Status: DC
  Filled 2023-08-22: qty 30, 30d supply, fill #0
  Filled 2023-09-17: qty 30, 30d supply, fill #1
  Filled 2023-10-22: qty 30, 30d supply, fill #2
  Filled 2023-11-18: qty 30, 30d supply, fill #3

## 2023-08-31 MED FILL — Venlafaxine HCl Cap ER 24HR 37.5 MG (Base Equivalent): ORAL | 90 days supply | Qty: 90 | Fill #3 | Status: AC

## 2023-08-31 MED FILL — Venlafaxine HCl Cap ER 24HR 150 MG (Base Equivalent): ORAL | 90 days supply | Qty: 90 | Fill #3 | Status: AC

## 2023-09-02 ENCOUNTER — Other Ambulatory Visit: Payer: Self-pay

## 2023-09-05 ENCOUNTER — Other Ambulatory Visit: Payer: Self-pay

## 2023-09-05 DIAGNOSIS — Z1389 Encounter for screening for other disorder: Secondary | ICD-10-CM | POA: Diagnosis not present

## 2023-09-05 DIAGNOSIS — Z1231 Encounter for screening mammogram for malignant neoplasm of breast: Secondary | ICD-10-CM | POA: Diagnosis not present

## 2023-09-05 DIAGNOSIS — Z130 Encounter for screening for diseases of the blood and blood-forming organs and certain disorders involving the immune mechanism: Secondary | ICD-10-CM | POA: Diagnosis not present

## 2023-09-05 DIAGNOSIS — N952 Postmenopausal atrophic vaginitis: Secondary | ICD-10-CM | POA: Diagnosis not present

## 2023-09-05 DIAGNOSIS — Z01419 Encounter for gynecological examination (general) (routine) without abnormal findings: Secondary | ICD-10-CM | POA: Diagnosis not present

## 2023-09-05 MED ORDER — ESTRADIOL 0.1 MG/GM VA CREA
1.0000 g | TOPICAL_CREAM | VAGINAL | 5 refills | Status: DC
Start: 2023-09-06 — End: 2024-01-21
  Filled 2023-09-05: qty 42.5, 30d supply, fill #0
  Filled 2023-09-30: qty 42.5, 90d supply, fill #0
  Filled 2024-01-01: qty 42.5, 90d supply, fill #1

## 2023-09-16 ENCOUNTER — Other Ambulatory Visit: Payer: Self-pay | Admitting: Family

## 2023-09-16 ENCOUNTER — Other Ambulatory Visit: Payer: Self-pay

## 2023-09-16 MED FILL — Empagliflozin Tab 10 MG: ORAL | 30 days supply | Qty: 30 | Fill #0 | Status: AC

## 2023-09-16 MED FILL — Lisinopril & Hydrochlorothiazide Tab 20-12.5 MG: ORAL | 90 days supply | Qty: 90 | Fill #0 | Status: AC

## 2023-09-17 ENCOUNTER — Other Ambulatory Visit: Payer: Self-pay

## 2023-09-30 ENCOUNTER — Other Ambulatory Visit: Payer: Self-pay

## 2023-10-01 ENCOUNTER — Other Ambulatory Visit: Payer: Self-pay

## 2023-10-01 ENCOUNTER — Other Ambulatory Visit: Payer: Self-pay | Admitting: Family

## 2023-10-01 MED FILL — Glipizide Tab ER 24HR 10 MG: ORAL | 90 days supply | Qty: 90 | Fill #0 | Status: AC

## 2023-10-02 ENCOUNTER — Other Ambulatory Visit: Payer: Self-pay

## 2023-10-17 ENCOUNTER — Other Ambulatory Visit: Payer: Self-pay

## 2023-10-17 ENCOUNTER — Encounter: Payer: Self-pay | Admitting: Internal Medicine

## 2023-10-17 ENCOUNTER — Other Ambulatory Visit (HOSPITAL_COMMUNITY)
Admission: RE | Admit: 2023-10-17 | Discharge: 2023-10-17 | Disposition: A | Discharge status: Home / Self Care | Source: Ambulatory Visit | Attending: Internal Medicine | Admitting: Internal Medicine

## 2023-10-17 ENCOUNTER — Ambulatory Visit: Admitting: Internal Medicine

## 2023-10-17 VITALS — BP 118/72 | HR 90 | Temp 98.3°F | Resp 16 | Ht 63.0 in | Wt 161.9 lb

## 2023-10-17 DIAGNOSIS — E782 Mixed hyperlipidemia: Secondary | ICD-10-CM | POA: Diagnosis not present

## 2023-10-17 DIAGNOSIS — N949 Unspecified condition associated with female genital organs and menstrual cycle: Secondary | ICD-10-CM | POA: Diagnosis not present

## 2023-10-17 DIAGNOSIS — Z113 Encounter for screening for infections with a predominantly sexual mode of transmission: Secondary | ICD-10-CM | POA: Diagnosis not present

## 2023-10-17 DIAGNOSIS — E1165 Type 2 diabetes mellitus with hyperglycemia: Secondary | ICD-10-CM | POA: Diagnosis not present

## 2023-10-17 DIAGNOSIS — Z7984 Long term (current) use of oral hypoglycemic drugs: Secondary | ICD-10-CM | POA: Diagnosis not present

## 2023-10-17 DIAGNOSIS — I10 Essential (primary) hypertension: Secondary | ICD-10-CM

## 2023-10-17 DIAGNOSIS — B3731 Acute candidiasis of vulva and vagina: Secondary | ICD-10-CM | POA: Insufficient documentation

## 2023-10-17 LAB — POCT URINALYSIS DIPSTICK
Bilirubin, UA: 0.2
Glucose, UA: POSITIVE — AB
Ketones, UA: NEGATIVE
Nitrite, UA: NEGATIVE
Protein, UA: POSITIVE — AB
Spec Grav, UA: 1.02 (ref 1.010–1.025)
Urobilinogen, UA: 0.2 U/dL
pH, UA: 5 (ref 5.0–8.0)

## 2023-10-17 LAB — POCT GLYCOSYLATED HEMOGLOBIN (HGB A1C): Hemoglobin A1C: 7 % — AB (ref 4.0–5.6)

## 2023-10-17 MED ORDER — RYBELSUS 14 MG PO TABS
14.0000 mg | ORAL_TABLET | Freq: Every day | ORAL | 3 refills | Status: AC
  Filled 2023-10-17 – 2023-12-22 (×2): qty 90, 90d supply, fill #0
  Filled 2024-03-17: qty 90, 90d supply, fill #1

## 2023-10-17 MED ORDER — ICOSAPENT ETHYL 1 G PO CAPS
2.0000 g | ORAL_CAPSULE | Freq: Two times a day (BID) | ORAL | 3 refills | Status: AC
  Filled 2023-10-17 – 2024-04-15 (×2): qty 360, 90d supply, fill #0

## 2023-10-17 MED ORDER — ROSUVASTATIN CALCIUM 40 MG PO TABS
40.0000 mg | ORAL_TABLET | Freq: Every day | ORAL | 3 refills | Status: AC
  Filled 2023-10-17: qty 90, 90d supply, fill #0
  Filled 2024-01-22: qty 90, 90d supply, fill #1
  Filled 2024-02-26 – 2024-04-15 (×2): qty 90, 90d supply, fill #2

## 2023-10-17 NOTE — Progress Notes (Signed)
 Established Patient Office Visit  Subjective    Patient ID: Tracy Burgess, female    DOB: [birth year]  Age: 58 y.o. MRN: [id number]  CC:  Chief Complaint  Patient presents with   Medical Management of Chronic Issues    HPI Tracy Burgess presents to follow up on chronic medical conditions.   Discussed the use of AI scribe software for clinical note transcription with the patient, who gave verbal consent to proceed.  History of Present Illness  Tracy Burgess is a 58 year old female with type 2 diabetes who presents for follow-up regarding her diabetes management and recent medication changes.  Her A1c has increased to 7.0 from 6.6. She discontinued Jardiance  two weeks ago due to a yeast infection and switched to Glucotrol . She previously experienced yeast infections with Farxiga , which resolved after stopping the medication.  Symptoms of the yeast infection include redness and itching, treated with topical Monistat cream. She denies burning or urgency, and there are no recent symptoms of a urinary tract infection.  Current medications include Glucotrol  10 mg once daily, Rybelsus  14 mg, Crestor , Vascepa , and Effexor . She uses the [employer] 3 for glucose monitoring. Refills are needed soon for Rybelsus , Crestor , and Vascepa .  Hypertension: -Medications: Lisinopril -hydrochlorothiazide  20-12.5 mg -Patient is compliant with above medications and reports no side effects. -Checking BP at home (average): not checking regularly  -Denies any SOB, CP, vision changes, LE edema or symptoms of hypotension  HLD: -Medications: Crestor  40 mg, Vascepa  2 g BID -Patient is compliant with above medications and reports no side effects.  -Last lipid panel: Lipid Panel     Component Value Date/Time   CHOL 149 03/13/2023 0941   TRIG 135 03/13/2023 0941   HDL 39 (L) 03/13/2023 0941   CHOLHDL 3.8 03/13/2023 0941   LDLCALC 86 03/13/2023 0941   LABVLDL 24 03/13/2023 0941   Diabetes, Type  2: -Last A1c 6.9% 11/24 -Was first diagnosed in 2012, had history of gestational diabetes 23 years ago -Medications: Jardiance  10 mg, Glipizide  10 mg, Rybelsus  14 mg. Now on Kerendia  since microalbumin was elevated -Failed meds: Farxiga  due to vaginitis - did have a recent yeast infection treated with Jardiance  but has been holding the Jardiance  for now -Patient is compliant with the above medications and reports no side effects.  -Has Free Style Libre 3  -Will occasionally have a low blood sugar in the middle of the night - sensor will wake her up at 69, had 3 recent low's, yesterday afternoon it was 60 -Fasting home BG: 90-140 -Post-prandial home BG: 150-200 -Eye exam: Due -Foot exam: UTD 2/25 -Microalbumin: UTD 11/24 -Statin: yes -PNA vaccine: UTD -Denies symptoms of hypoglycemia, polyuria, polydipsia, numbness extremities, foot ulcers/trauma.   MDD: -Mood status: stable -Current treatment: Effexor  187.5  mg, Paxil  10 mg -Satisfied with current treatment?: yes -Duration of current treatment : years -Side effects: no Medication compliance: excellent compliance     10/17/2023    2:32 PM 06/11/2023    3:19 PM  Depression screen PHQ 2/9  Decreased Interest 0 0  Down, Depressed, Hopeless 0 0  PHQ - 2 Score 0 0   Environmental Allergies: -Taking Claritin daily, controlling symptoms  Menopausal Symptoms: -Currently on Estrace  vaginal cream prescribed by her Gynecologist   Health Maintenance: -Blood work UTD -Mammogram through Gynecology -Colon cancer screening: colonoscopy 02/2022, repeat in 3 years -Vaccines UTD   Outpatient Encounter Medications as of 10/17/2023  Medication Sig   Cholecalciferol  (VITAMIN D ) 50 MCG (  2000 UT) CAPS Take 1 capsule (2,000 Units total) by mouth once a week.   empagliflozin  (JARDIANCE ) 10 MG TABS tablet Take 1 tablet (10 mg total) by mouth daily before breakfast.   estradiol  (ESTRACE ) 0.1 MG/GM vaginal cream Insert 1 g 3 times a week by vaginal  route at bedtime for 365 days.   Finerenone  (KERENDIA ) 10 MG TABS Take 1 tablet (10 mg total) by mouth daily.   glipiZIDE  (GLUCOTROL  XL) 10 MG 24 hr tablet Take 1 tablet (10 mg total) by mouth every morning for diabetes   ibuprofen (ADVIL,MOTRIN) 200 MG tablet Take 200 mg by mouth every 6 (six) hours as needed.   lisinopril -hydrochlorothiazide  (ZESTORETIC ) 20-12.5 MG tablet Take 1 tablet by mouth daily.   Loratadine (CLARITIN) 10 MG CAPS Take 1 capsule by mouth daily.   PARoxetine  (PAXIL ) 10 MG tablet Take 1 tablet by mouth daily   rosuvastatin  (CRESTOR ) 40 MG tablet Take 1 tablet (40 mg total) by mouth daily.   Semaglutide  (RYBELSUS ) 14 MG TABS Take 1 tablet (14 mg total) by mouth daily  in AM 30 min prior to eating with a small sip of water   VASCEPA  1 g capsule Take 2 capsules (2 g total) by mouth every morning and every evening.   venlafaxine  XR (EFFEXOR -XR) 150 MG 24 hr capsule Take 1 capsule (150 mg total) by mouth daily. Take with 37.5mg  for total of 187.5mg  daily.   venlafaxine  XR (EFFEXOR -XR) 37.5 MG 24 hr capsule Take 1 capsule (37.5 mg total) by mouth daily. Take with 150mg  for total of 187.5mg  daily.   Continuous Glucose Sensor (FREESTYLE LIBRE 3 PLUS SENSOR) MISC Change sensor every 15 days. (Patient not taking: Reported on 10/17/2023)   estradiol  (ESTRACE ) 0.1 MG/GM vaginal cream Insert 1 gram 3 times a week by vaginal route at bedtime (Patient not taking: Reported on 10/17/2023)   No facility-administered encounter medications on file as of 10/17/2023.    Past Medical History:  Diagnosis Date   Allergy    Anxiety    Depression    Diabetes mellitus without complication (HCC)    History of colonoscopy with polypectomy    Hyperlipidemia    Hypertension     Past Surgical History:  Procedure Laterality Date   CARPAL TUNNEL RELEASE Bilateral [id number]   CESAREAN SECTION  0454,[id number]   COLONOSCOPY N/A 12/16/2020   Procedure: COLONOSCOPY;  Surgeon: Irby Mannan, MD;   Location: [facility] ENDOSCOPY;  Service: Endoscopy;  Laterality: N/A;   COLONOSCOPY N/A 04/21/2021   Procedure: COLONOSCOPY;  Surgeon: Irby Mannan, MD;  Location: [facility] ENDOSCOPY;  Service: Endoscopy;  Laterality: N/A;   COLONOSCOPY WITH PROPOFOL  N/A 01/05/2020   Procedure: COLONOSCOPY WITH PROPOFOL ;  Surgeon: Irby Mannan, MD;  Location: [facility] ENDOSCOPY;  Service: Endoscopy;  Laterality: N/A;   COLONOSCOPY WITH PROPOFOL  N/A 03/06/2022   Procedure: COLONOSCOPY WITH PROPOFOL ;  Surgeon: Luke Salaam, MD;  Location: [facility] ENDOSCOPY;  Service: Gastroenterology;  Laterality: N/A;   TONSILLECTOMY  1992   tubes in ears  1971    Family History  Problem Relation Age of Onset   Breast cancer Paternal Grandmother 90   Diabetes Father     Social History   Socioeconomic History   Marital status: Single    Spouse name: Not on file   Number of children: Not on file   Years of education: Not on file   Highest education level: Bachelor's degree (e.g., BA, AB, BS)  Occupational History   Not on file  Tobacco  Use   Smoking status: Never   Smokeless tobacco: Never  Vaping Use   Vaping status: Never Used  Substance and Sexual Activity   Alcohol use: Yes    Alcohol/week: 0.0 standard drinks of alcohol   Drug use: No   Sexual activity: Not on file  Other Topics Concern   Not on file  Social History Narrative   Not on file   Social Drivers of Health   Financial Resource Strain: Low Risk  (10/16/2023)   Overall Financial Resource Strain (CARDIA)    Difficulty of Paying Living Expenses: Not hard at all  Food Insecurity: No Food Insecurity (10/16/2023)   Hunger Vital Sign    Worried About Running Out of Food in the Last Year: Never true    Ran Out of Food in the Last Year: Never true  Transportation Needs: No Transportation Needs (10/16/2023)   PRAPARE - Administrator, Civil Service (Medical): No    Lack of Transportation (Non-Medical): No  Physical Activity: Insufficiently  Active (10/16/2023)   Exercise Vital Sign    Days of Exercise per Week: 2 days    Minutes of Exercise per Session: 60 min  Stress: No Stress Concern Present (10/16/2023)   [employer] of Occupational Health - Occupational Stress Questionnaire    Feeling of Stress: Not at all  Social Connections: Moderately Isolated (10/16/2023)   Social Connection and Isolation Panel    Frequency of Communication with Friends and Family: More than three times a week    Frequency of Social Gatherings with Friends and Family: More than three times a week    Attends Religious Services: 1 to 4 times per year    Active Member of [employer] or Organizations: No    Attends Engineer, structural: Not on file    Marital Status: Divorced  Intimate Partner Violence: Not on file    Review of Systems  Genitourinary:  Negative for dysuria, frequency, hematuria and urgency.  All other systems reviewed and are negative.       Objective    BP 118/72 (Cuff Size: Large)   Pulse 90   Temp 98.3 F (36.8 C) (Oral)   Resp 16   Ht 5' 3 (1.6 m)   Wt 161 lb 14.4 oz (73.4 kg)   LMP 09/09/2017   SpO2 97%   BMI 28.68 kg/m   Physical Exam Constitutional:      Appearance: Normal appearance.   Eyes:     Conjunctiva/sclera: Conjunctivae normal.    Cardiovascular:     Rate and Rhythm: Normal rate and regular rhythm.  Pulmonary:     Effort: Pulmonary effort is normal.     Breath sounds: Normal breath sounds.   Skin:    General: Skin is warm and dry.   Neurological:     General: No focal deficit present.     Mental Status: She is alert. Mental status is at baseline.   Psychiatric:        Mood and Affect: Mood normal.        Behavior: Behavior normal.         Assessment & Plan:   Assessment & Plan  Type 2 Diabetes Mellitus A1c increased to 7.0. Discontinued Jardiance  due to recurrent yeast infections. Similar issues with Farxiga . Currently on Glucotrol  and Rybelsus . Discussed risks of  untreated UTIs and potential need to discontinue Jardiance  if infections recur. - Perform urinalysis and vaginal swab for infections. UA with leukocytes, blood and protein, will send  for culture.  - Consider restarting Jardiance  if infections are clear. - Discontinue Jardiance  if yeast infection recurs and continue Glucotrol . - Recheck A1c in 3 months. - Consider switching Rybelsus  to injectable GLP-1 agonist if A1c increases.  Hypertension Blood pressure well-controlled at 118/72 mmHg, no changes to current medications.  Hyperlipidemia On Crestor  and Vascepa . Crestor  due for refill. - Refill Crestor . - Refill Vascepa .  Depression Taking Effexor  and Paxil . Reports being good on Effexor .  General Health Maintenance Using [facility] 3 for glucose monitoring. Managing medications with assistance from previous provider. - Ensure Fre[employer] 3 is functioning and covered. - Coordinate medication refills with pharmacy.  Follow-up Re-evaluation in three months to assess diabetes management and overall health status. - Schedule follow-up appointment in 3 months.  - POCT HgB A1C - Semaglutide  (RYBELSUS ) 14 MG TABS; Take 1 tablet (14 mg total) by mouth daily  in AM 30 min prior to eating with a small sip of water  Dispense: 90 tablet; Refill: 3 - VASCEPA  1 g capsule; Take 2 capsules (2 g total) by mouth every morning and every evening.  Dispense: 360 capsule; Refill: 3 - rosuvastatin  (CRESTOR ) 40 MG tablet; Take 1 tablet (40 mg total) by mouth daily.  Dispense: 90 tablet; Refill: 3 - POCT Urinalysis Dipstick - Cervicovaginal ancillary only - Urine Culture   Return in about 3 months (around 01/17/2024) for follow up on a1c.   Rockney Cid, DO

## 2023-10-18 ENCOUNTER — Ambulatory Visit: Admitting: Internal Medicine

## 2023-10-18 LAB — URINE CULTURE
MICRO NUMBER:: 16602197
Result:: NO GROWTH
SPECIMEN QUALITY:: ADEQUATE

## 2023-10-20 ENCOUNTER — Ambulatory Visit: Payer: Self-pay | Admitting: Internal Medicine

## 2023-10-21 LAB — CERVICOVAGINAL ANCILLARY ONLY
Bacterial Vaginitis (gardnerella): POSITIVE — AB
Candida Glabrata: NEGATIVE
Candida Vaginitis: POSITIVE — AB
Comment: NEGATIVE
Comment: NEGATIVE
Comment: NEGATIVE
Comment: NEGATIVE
Trichomonas: NEGATIVE

## 2023-10-22 ENCOUNTER — Other Ambulatory Visit: Payer: Self-pay

## 2023-10-22 MED ORDER — FLUCONAZOLE 150 MG PO TABS
150.0000 mg | ORAL_TABLET | Freq: Once | ORAL | 0 refills | Status: AC
  Filled 2023-10-22: qty 1, 1d supply, fill #0

## 2023-11-12 DIAGNOSIS — H5203 Hypermetropia, bilateral: Secondary | ICD-10-CM | POA: Diagnosis not present

## 2023-11-12 DIAGNOSIS — H524 Presbyopia: Secondary | ICD-10-CM | POA: Diagnosis not present

## 2023-11-12 DIAGNOSIS — H52223 Regular astigmatism, bilateral: Secondary | ICD-10-CM | POA: Diagnosis not present

## 2023-11-18 ENCOUNTER — Other Ambulatory Visit: Payer: Self-pay

## 2023-11-24 ENCOUNTER — Other Ambulatory Visit: Payer: Self-pay | Admitting: Nurse Practitioner

## 2023-11-24 MED FILL — Empagliflozin Tab 10 MG: ORAL | 30 days supply | Qty: 30 | Fill #1 | Status: AC

## 2023-11-25 ENCOUNTER — Other Ambulatory Visit: Payer: Self-pay | Admitting: Nurse Practitioner

## 2023-11-25 ENCOUNTER — Other Ambulatory Visit: Payer: Self-pay

## 2023-11-26 ENCOUNTER — Other Ambulatory Visit: Payer: Self-pay | Admitting: Nurse Practitioner

## 2023-11-26 ENCOUNTER — Other Ambulatory Visit: Payer: Self-pay

## 2023-11-27 ENCOUNTER — Other Ambulatory Visit: Payer: Self-pay | Admitting: Nurse Practitioner

## 2023-11-27 ENCOUNTER — Other Ambulatory Visit: Payer: Self-pay

## 2023-11-27 ENCOUNTER — Encounter: Payer: Self-pay | Admitting: Internal Medicine

## 2023-11-28 ENCOUNTER — Other Ambulatory Visit: Payer: Self-pay

## 2023-11-28 MED ORDER — VENLAFAXINE HCL ER 150 MG PO CP24
150.0000 mg | ORAL_CAPSULE | Freq: Every day | ORAL | 1 refills | Status: DC
  Filled 2023-11-28 (×2): qty 90, 90d supply, fill #0
  Filled 2024-02-26: qty 90, 90d supply, fill #1

## 2023-11-28 MED ORDER — VENLAFAXINE HCL ER 37.5 MG PO CP24
37.5000 mg | ORAL_CAPSULE | Freq: Every day | ORAL | 1 refills | Status: DC
  Filled 2023-11-28: qty 90, 90d supply, fill #0
  Filled 2024-02-26: qty 90, 90d supply, fill #1

## 2023-12-22 MED FILL — Lisinopril & Hydrochlorothiazide Tab 20-12.5 MG: ORAL | 90 days supply | Qty: 90 | Fill #1 | Status: AC

## 2023-12-23 ENCOUNTER — Other Ambulatory Visit: Payer: Self-pay

## 2023-12-23 ENCOUNTER — Other Ambulatory Visit: Payer: Self-pay | Admitting: Family

## 2023-12-23 MED FILL — Empagliflozin Tab 10 MG: ORAL | 30 days supply | Qty: 30 | Fill #2 | Status: AC

## 2023-12-27 ENCOUNTER — Other Ambulatory Visit: Payer: Self-pay | Admitting: Family

## 2023-12-27 ENCOUNTER — Other Ambulatory Visit: Payer: Self-pay

## 2023-12-27 MED FILL — Glipizide Tab ER 24HR 10 MG: ORAL | 90 days supply | Qty: 90 | Fill #1 | Status: AC

## 2023-12-31 ENCOUNTER — Telehealth: Payer: Self-pay

## 2023-12-31 ENCOUNTER — Other Ambulatory Visit: Payer: Self-pay

## 2023-12-31 ENCOUNTER — Other Ambulatory Visit: Payer: Self-pay | Admitting: Family

## 2023-12-31 NOTE — Telephone Encounter (Signed)
 Copied from CRM [id number]. Topic: Clinical - Medication Question >> Dec 31, 2023  4:33 PM Winona R wrote: Pt would like to know if Sharyle Fischer can take over her medication Kerendia  10 MG which she takes once a day as she no longer see the previous provider who prescribed it to her. Pt states it is to help decrease the protein in her urine. Pt would like to be sent to [facility]  Phone: [phone]   gional

## 2024-01-01 ENCOUNTER — Other Ambulatory Visit: Payer: Self-pay

## 2024-01-01 MED FILL — Finerenone Tab 10 MG: ORAL | 30 days supply | Qty: 30 | Fill #0 | Status: AC

## 2024-01-01 NOTE — Telephone Encounter (Signed)
 Patient stated she is out, please send refill [facility]

## 2024-01-02 ENCOUNTER — Other Ambulatory Visit: Payer: Self-pay

## 2024-01-02 ENCOUNTER — Other Ambulatory Visit: Payer: Self-pay | Admitting: Internal Medicine

## 2024-01-03 NOTE — Telephone Encounter (Signed)
 Patient got refill was from previous office refills

## 2024-01-21 ENCOUNTER — Other Ambulatory Visit: Payer: Self-pay

## 2024-01-21 ENCOUNTER — Encounter: Payer: Self-pay | Admitting: Internal Medicine

## 2024-01-21 ENCOUNTER — Ambulatory Visit: Admitting: Internal Medicine

## 2024-01-21 VITALS — BP 120/82 | HR 71 | Temp 98.4°F | Resp 16 | Ht 63.0 in | Wt 165.4 lb

## 2024-01-21 DIAGNOSIS — Z7984 Long term (current) use of oral hypoglycemic drugs: Secondary | ICD-10-CM

## 2024-01-21 DIAGNOSIS — F339 Major depressive disorder, recurrent, unspecified: Secondary | ICD-10-CM

## 2024-01-21 DIAGNOSIS — E1165 Type 2 diabetes mellitus with hyperglycemia: Secondary | ICD-10-CM

## 2024-01-21 DIAGNOSIS — I10 Essential (primary) hypertension: Secondary | ICD-10-CM

## 2024-01-21 DIAGNOSIS — E559 Vitamin D deficiency, unspecified: Secondary | ICD-10-CM

## 2024-01-21 DIAGNOSIS — E782 Mixed hyperlipidemia: Secondary | ICD-10-CM

## 2024-01-21 MED ORDER — PAROXETINE HCL 10 MG PO TABS
10.0000 mg | ORAL_TABLET | Freq: Every day | ORAL | 1 refills | Status: AC
  Filled 2024-01-21: qty 90, 90d supply, fill #0
  Filled 2024-04-15: qty 90, 90d supply, fill #1

## 2024-01-21 NOTE — Patient Instructions (Signed)
 Recombinant Zoster (Shingles) Vaccine: What You Need to Know Many vaccine information statements are available in Spanish and other languages. See PromoAge.com.br. 1. Why get vaccinated? Recombinant zoster (shingles) vaccine can prevent shingles. Shingles (also called herpes zoster, or just zoster) is a painful skin rash, usually with blisters. In addition to the rash, shingles can cause fever, headache, chills, or upset stomach. Rarely, shingles can lead to complications such as pneumonia, hearing problems, blindness, brain inflammation (encephalitis), or death. The risk of shingles increases with age. The most common complication of shingles is long-term nerve pain called postherpetic neuralgia (PHN). PHN occurs in the areas where the shingles rash was and can last for months or years after the rash goes away. The pain from PHN can be severe and debilitating. The risk of PHN increases with age. An older adult with shingles is more likely to develop PHN and have longer lasting and more severe pain than a younger person. People with weakened immune systems also have a higher risk of getting shingles and complications from the disease. Shingles is caused by varicella-zoster virus, the same virus that causes chickenpox. After you have chickenpox, the virus stays in your body and can cause shingles later in life. Shingles cannot be passed from one person to another, but the virus that causes shingles can spread and cause chickenpox in someone who has never had chickenpox or has never received chickenpox vaccine. 2. Recombinant shingles vaccine Recombinant shingles vaccine provides strong protection against shingles. By preventing shingles, recombinant shingles vaccine also protects against PHN and other complications. Recombinant shingles vaccine is recommended for: Adults 50 years and older Adults 19 years and older who have a weakened immune system because of disease or treatments Shingles vaccine  is given as a two-dose series. For most people, the second dose should be given 2 to 6 months after the first dose. Some people who have or will have a weakened immune system can get the second dose 1 to 2 months after the first dose. Ask your health care provider for guidance. People who have had shingles in the past and people who have received varicella (chickenpox) vaccine are recommended to get recombinant shingles vaccine. The vaccine is also recommended for people who have already gotten another type of shingles vaccine, the live shingles vaccine. There is no live virus in recombinant shingles vaccine. Shingles vaccine may be given at the same time as other vaccines. 3. Talk with your health care provider Tell your vaccination provider if the person getting the vaccine: Has had an allergic reaction after a previous dose of recombinant shingles vaccine, or has any severe, life-threatening allergies Is currently experiencing an episode of shingles Is pregnant In some cases, your health care provider may decide to postpone shingles vaccination until a future visit. People with minor illnesses, such as a cold, may be vaccinated. People who are moderately or severely ill should usually wait until they recover before getting recombinant shingles vaccine. Your health care provider can give you more information. 4. Risks of a vaccine reaction A sore arm with mild or moderate pain is very common after recombinant shingles vaccine. Redness and swelling can also happen at the site of the injection. Tiredness, muscle pain, headache, shivering, fever, stomach pain, and nausea are common after recombinant shingles vaccine. These side effects may temporarily prevent a vaccinated person from doing regular activities. Symptoms usually go away on their own in 2 to 3 days. You should still get the second dose of recombinant shingles vaccine  even if you had one of these reactions after the first  dose. Guillain-Barr syndrome (GBS), a serious nervous system disorder, has been reported very rarely after recombinant zoster vaccine. People sometimes faint after medical procedures, including vaccination. Tell your provider if you feel dizzy or have vision changes or ringing in the ears. As with any medicine, there is a very remote chance of a vaccine causing a severe allergic reaction, other serious injury, or death. 5. What if there is a serious problem? An allergic reaction could occur after the vaccinated person leaves the clinic. If you see signs of a severe allergic reaction (hives, swelling of the face and throat, difficulty breathing, a fast heartbeat, dizziness, or weakness), call 9-1-1 and get the person to the nearest hospital. For other signs that concern you, call your health care provider. Adverse reactions should be reported to the Vaccine Adverse Event Reporting System (VAERS). Your health care provider will usually file this report, or you can do it yourself. Visit the VAERS website at www.vaers.LAgents.no or call (424)557-8947. VAERS is only for reporting reactions, and VAERS staff members do not give medical advice. 6. How can I learn more? Ask your health care provider. Call your local or state health department. Visit the website of the Food and Drug Administration (FDA) for vaccine package inserts and additional information at GoldCloset.com.ee. Contact the Centers for Disease Control and Prevention (CDC): Call 3203662500 (1-800-CDC-INFO) or Visit CDC's website at PicCapture.uy. Source: CDC Vaccine Information Statement Recombinant Zoster Vaccine (06/03/2020) This same material is available at FootballExhibition.com.br for no charge. This information is not intended to replace advice given to you by your health care provider. Make sure you discuss any questions you have with your health care provider. Document Revised: 08/01/2022 Document Reviewed:  05/02/2022 Elsevier Patient Education  2024 [employer].

## 2024-01-21 NOTE — Progress Notes (Signed)
 Established Patient Office Visit  Subjective    Patient ID: Tracy Burgess, female    DOB: 02/17/1966  Age: 58 y.o. MRN: [id number]  CC:  Chief Complaint  Patient presents with   Medical Management of Chronic Issues    3 month recheck    HPI Tracy Burgess presents to follow up on chronic medical conditions.   Discussed the use of AI scribe software for clinical note transcription with the patient, who gave verbal consent to proceed.  History of Present Illness Tracy Burgess is a 58 year old female with type 2 diabetes who presents for routine follow-up and lab work.  Her blood sugar levels are inconsistent, with elevated morning readings, including 204 mg/dL today despite fasting. She takes glipizide  10 mg once daily, Jardiance  at a lower dose, and Rybelsus  at the maximum dose. Carbohydrate intake significantly impacts her blood sugar levels.  She is not currently taking vitamin D  supplements as she ran out of her over-the-counter supply and has not used prescription strength vitamin D . She experiences some nocturnal hypoglycemic episodes, although her sensor has not recently alerted her.  She requests a refill for Paxil , which she receives on a three-month supply, and notes that her pharmacy will switch to mail order in January. She also uses estrogen cream.  She has received her flu and pneumonia vaccines and is up to date on her tetanus shot. She is considering the shingles vaccine due to her mother's severe case of shingles on her scalp, which caused significant headaches.  Hypertension: -Medications: Lisinopril -hydrochlorothiazide  20-12.5 mg -Patient is compliant with above medications and reports no side effects. -Checking BP at home (average): not checking regularly  -Denies any SOB, CP, vision changes, LE edema or symptoms of hypotension  HLD: -Medications: Crestor  40 mg, Vascepa  2 g BID -Patient is compliant with above medications and reports no side effects.   -Last lipid panel: Lipid Panel     Component Value Date/Time   CHOL 149 03/13/2023 0941   TRIG 135 03/13/2023 0941   HDL 39 (L) 03/13/2023 0941   CHOLHDL 3.8 03/13/2023 0941   LDLCALC 86 03/13/2023 0941   LABVLDL 24 03/13/2023 0941   Diabetes, Type 2: -Last A1c 7.0% 6/25 -Was first diagnosed in 2012, had history of gestational diabetes 23 years ago -Medications: Jardiance  10 mg, Glipizide  10 mg, Rybelsus  14 mg. Now on Kerendia  since microalbumin was elevated -Failed meds: Farxiga  due to vaginitis - did have a recent yeast infection treated with Jardiance  but has been holding the Jardiance  for now -Patient is compliant with the above medications and reports no side effects.  -Has Free Style Libre 3  -Will occasionally have a low blood sugar in the middle of the night but hasn't had any lately  -Fasting home BG: 204 this morning -Post-prandial home BG: 150-200 -Eye exam: UTD -Foot exam: UTD 2/25 -Microalbumin: UTD 11/24 -Statin: yes -PNA vaccine: UTD -Denies symptoms of hypoglycemia, polyuria, polydipsia, numbness extremities, foot ulcers/trauma.   MDD: -Mood status: stable -Current treatment: Effexor  187.5  mg, Paxil  10 mg -Satisfied with current treatment?: yes -Duration of current treatment : years -Side effects: no Medication compliance: excellent compliance     10/17/2023    2:32 PM 06/11/2023    3:19 PM  Depression screen PHQ 2/9  Decreased Interest 0 0  Down, Depressed, Hopeless 0 0  PHQ - 2 Score 0 0   Environmental Allergies: -Taking Claritin daily, controlling symptoms  Menopausal Symptoms: -Currently on Estrace  vaginal cream prescribed  by her Gynecologist   Health Maintenance: -Blood work due -Mammogram through Gynecology -Colon cancer screening: colonoscopy 02/2022, repeat in 3 years -Vaccines UTD, discussed shingles vaccines    Outpatient Encounter Medications as of 01/21/2024  Medication Sig   Cholecalciferol  (VITAMIN D ) 50 MCG (2000 UT) CAPS Take  1 capsule (2,000 Units total) by mouth once a week.   Continuous Glucose Sensor (FREESTYLE LIBRE 3 PLUS SENSOR) MISC Change sensor every 15 days. (Patient not taking: Reported on 10/17/2023)   empagliflozin  (JARDIANCE ) 10 MG TABS tablet Take 1 tablet (10 mg total) by mouth daily before breakfast.   estradiol  (ESTRACE ) 0.1 MG/GM vaginal cream Insert 1 g 3 times a week by vaginal route at bedtime for 365 days.   estradiol  (ESTRACE ) 0.1 MG/GM vaginal cream Insert 1 gram 3 times a week by vaginal route at bedtime (Patient not taking: Reported on 10/17/2023)   glipiZIDE  (GLUCOTROL  XL) 10 MG 24 hr tablet Take 1 tablet (10 mg total) by mouth every morning for diabetes   ibuprofen (ADVIL,MOTRIN) 200 MG tablet Take 200 mg by mouth every 6 (six) hours as needed.   Finerenone  (KERENDIA ) 10 MG TABS Take 1 tablet (10 mg total) by mouth daily.   lisinopril -hydrochlorothiazide  (ZESTORETIC ) 20-12.5 MG tablet Take 1 tablet by mouth daily.   Loratadine (CLARITIN) 10 MG CAPS Take 1 capsule by mouth daily.   PARoxetine  (PAXIL ) 10 MG tablet Take 1 tablet by mouth daily   rosuvastatin  (CRESTOR ) 40 MG tablet Take 1 tablet (40 mg total) by mouth daily.   Semaglutide  (RYBELSUS ) 14 MG TABS Take 1 tablet (14 mg total) by mouth daily in the morning 30 minutes prior to eating with a small sip of water   VASCEPA  1 g capsule Take 2 capsules (2 g total) by mouth every morning and every evening.   venlafaxine  XR (EFFEXOR -XR) 150 MG 24 hr capsule Take 1 capsule (150 mg total) by mouth daily. Take with 37.5mg  for total of 187.5mg  daily.   venlafaxine  XR (EFFEXOR -XR) 37.5 MG 24 hr capsule Take 1 capsule (37.5 mg total) by mouth daily. Take with 150mg  for total of 187.5mg  daily.   No facility-administered encounter medications on file as of 01/21/2024.    Past Medical History:  Diagnosis Date   Allergy    Anxiety    Depression    Diabetes mellitus without complication (HCC)    History of colonoscopy with polypectomy     Hyperlipidemia    Hypertension     Past Surgical History:  Procedure Laterality Date   CARPAL TUNNEL RELEASE Bilateral [id number]   CESAREAN SECTION  [id number]   COLONOSCOPY N/A 12/16/2020   Procedure: COLONOSCOPY;  Surgeon: Janalyn Keene NOVAK, MD;  Location: [facility] ENDOSCOPY;  Service: Endoscopy;  Laterality: N/A;   COLONOSCOPY N/A 04/21/2021   Procedure: COLONOSCOPY;  Surgeon: Janalyn Keene NOVAK, MD;  Location: [facility] ENDOSCOPY;  Service: Endoscopy;  Laterality: N/A;   COLONOSCOPY WITH PROPOFOL  N/A 01/05/2020   Procedure: COLONOSCOPY WITH PROPOFOL ;  Surgeon: Janalyn Keene NOVAK, MD;  Location: [facility] ENDOSCOPY;  Service: Endoscopy;  Laterality: N/A;   COLONOSCOPY WITH PROPOFOL  N/A 03/06/2022   Procedure: COLONOSCOPY WITH PROPOFOL ;  Surgeon: Therisa Bi, MD;  Location[facility] ENDOSCOPY;  Service: Gastroenterology;  Laterality: N/A;   TONSILLECTOMY  1992   tubes in ears  1971    Family History  Problem Relation Age of Onset   Breast cancer Paternal Grandmother 33   Diabetes Father     Social History   Socioeconomic History   Marital status:  Single    Spouse name: Not on file   Number of children: Not on file   Years of education: Not on file   Highest education level: Bachelor's degree (e.g., BA, AB, BS)  Occupational History   Not on file  Tobacco Use   Smoking status: Never   Smokeless tobacco: Never  Vaping Use   Vaping status: Never Used  Substance and Sexual Activity   Alcohol use: Yes    Alcohol/week: 0.0 standard drinks of alcohol   Drug use: No   Sexual activity: Not on file  Other Topics Concern   Not on file  Social History Narrative   Not on file   Social Drivers of Health   Financial Resource Strain: Low Risk  (10/16/2023)   Overall Financial Resource Strain (CARDIA)    Difficulty of Paying Living Expenses: Not hard at all  Food Insecurity: No Food Insecurity (10/16/2023)   Hunger Vital Sign    Worried About Running Out of Food in the Last Year: Never true     Ran Out of Food in the Last Year: Never true  Transportation Needs: No Transportation Needs (10/16/2023)   PRAPARE - Administrator, Civil Service (Medical): No    Lack of Transportation (Non-Medical): No  Physical Activity: Insufficiently Active (10/16/2023)   Exercise Vital Sign    Days of Exercise per Week: 2 days    Minutes of Exercise per Session: 60 min  Stress: No Stress Concern Present (10/16/2023)   [employer] of Occupational Health - Occupational Stress Questionnaire    Feeling of Stress: Not at all  Social Connections: Moderately Isolated (10/16/2023)   Social Connection and Isolation Panel    Frequency of Communication with Friends and Family: More than three times a week    Frequency of Social Gatherings with Friends and Family: More than three times a week    Attends Religious Services: 1 to 4 times per year    Active Member of [employer] or Organizations: No    Attends Engineer, structural: Not on file    Marital Status: Divorced  Intimate Partner Violence: Not on file    Review of Systems  Genitourinary:  Negative for dysuria, frequency, hematuria and urgency.  All other systems reviewed and are negative.       Objective    BP 120/82 (Cuff Size: Large)   Pulse 71   Temp 98.4 F (36.9 C) (Oral)   Resp 16   Ht 5' 3 (1.6 m)   Wt 165 lb 6.4 oz (75 kg)   LMP 09/09/2017   SpO2 97%   BMI 29.30 kg/m   Physical Exam Constitutional:      Appearance: Normal appearance.  Eyes:     Conjunctiva/sclera: Conjunctivae normal.  Cardiovascular:     Rate and Rhythm: Normal rate and regular rhythm.  Pulmonary:     Effort: Pulmonary effort is normal.     Breath sounds: Normal breath sounds.  Skin:    General: Skin is warm and dry.  Neurological:     General: No focal deficit present.     Mental Status: She is alert. Mental status is at baseline.  Psychiatric:        Mood and Affect: Mood normal.        Behavior: Behavior normal.          Assessment & Plan:   Assessment & Plan Type 2 diabetes mellitus Fluctuating blood glucose levels with morning hyperglycemia. Current medications include glipizide ,  Jardiance , and Rybelsus . Previous intolerance to Farxiga . A1c was 7.0%. - Order A1c test. - Consider increasing Jardiance  to 25 mg if A1c is elevated. - Monitor for UTI and yeast infection symptoms if Jardiance  dose is increased.  Hypertension Blood pressure stable here today, no changes made to medications.  Hyperlipidemia Continue current treatment.  - Order lipid panel.  Depression Managed with Paxil . Transition to mail order pharmacy planned for January. - Prescribe Paxil  with a 90-day supply and refill. - Plan transition to mail order pharmacy in January.  Vitamin D  deficiency (suspected) Suspected due to discontinuation of supplementation. - Order vitamin D  level.  - HgB A1c - CBC w/Diff/Platelet - Comprehensive Metabolic Panel (CMET) - Lipid Profile - PARoxetine  (PAXIL ) 10 MG tablet; Take 1 tablet by mouth daily  Dispense: 90 tablet; Refill: 1 - Vitamin D  (25 hydroxy)  Return in about 6 months (around 07/20/2024).   Sharyle Fischer, DO

## 2024-01-22 ENCOUNTER — Ambulatory Visit: Payer: Self-pay | Admitting: Internal Medicine

## 2024-01-22 LAB — CBC WITH DIFFERENTIAL/PLATELET
Absolute Lymphocytes: 2053 {cells}/uL (ref 850–3900)
Absolute Monocytes: 554 {cells}/uL (ref 200–950)
Basophils Absolute: 33 {cells}/uL (ref 0–200)
Basophils Relative: 0.5 %
Eosinophils Absolute: 132 {cells}/uL (ref 15–500)
Eosinophils Relative: 2 %
HCT: 45.5 % — ABNORMAL HIGH (ref 35.0–45.0)
Hemoglobin: 15 g/dL (ref 11.7–15.5)
MCH: 29.1 pg (ref 27.0–33.0)
MCHC: 33 g/dL (ref 32.0–36.0)
MCV: 88.3 fL (ref 80.0–100.0)
MPV: 10.1 fL (ref 7.5–12.5)
Monocytes Relative: 8.4 %
Neutro Abs: 3828 {cells}/uL (ref 1500–7800)
Neutrophils Relative %: 58 %
Platelets: 288 Thousand/uL (ref 140–400)
RBC: 5.15 Million/uL — ABNORMAL HIGH (ref 3.80–5.10)
RDW: 13.4 % (ref 11.0–15.0)
Total Lymphocyte: 31.1 %
WBC: 6.6 Thousand/uL (ref 3.8–10.8)

## 2024-01-22 LAB — COMPREHENSIVE METABOLIC PANEL WITH GFR
AG Ratio: 2.3 (calc) (ref 1.0–2.5)
ALT: 23 U/L (ref 6–29)
AST: 18 U/L (ref 10–35)
Albumin: 4.9 g/dL (ref 3.6–5.1)
Alkaline phosphatase (APISO): 44 U/L (ref 37–153)
BUN/Creatinine Ratio: 50 (calc) — ABNORMAL HIGH (ref 6–22)
BUN: 23 mg/dL (ref 7–25)
CO2: 29 mmol/L (ref 20–32)
Calcium: 9.5 mg/dL (ref 8.6–10.4)
Chloride: 103 mmol/L (ref 98–110)
Creat: 0.46 mg/dL — ABNORMAL LOW (ref 0.50–1.03)
Globulin: 2.1 g/dL (ref 1.9–3.7)
Glucose, Bld: 115 mg/dL — ABNORMAL HIGH (ref 65–99)
Potassium: 4.6 mmol/L (ref 3.5–5.3)
Sodium: 141 mmol/L (ref 135–146)
Total Bilirubin: 0.5 mg/dL (ref 0.2–1.2)
Total Protein: 7 g/dL (ref 6.1–8.1)
eGFR: 112 mL/min/1.73m2 (ref >=60)

## 2024-01-22 LAB — LIPID PANEL
Cholesterol: 159 mg/dL (ref <200)
HDL: 39 mg/dL — ABNORMAL LOW (ref >=50)
LDL Cholesterol (Calc): 97 mg/dL
Non-HDL Cholesterol (Calc): 120 mg/dL (ref <130)
Total CHOL/HDL Ratio: 4.1 (calc) (ref <5.0)
Triglycerides: 124 mg/dL (ref <150)

## 2024-01-22 LAB — VITAMIN D 25 HYDROXY (VIT D DEFICIENCY, FRACTURES): Vit D, 25-Hydroxy: 33 ng/mL (ref 30–100)

## 2024-01-22 LAB — HEMOGLOBIN A1C
Hgb A1c MFr Bld: 6.7 % — ABNORMAL HIGH (ref <5.7)
Mean Plasma Glucose: 146 mg/dL
eAG (mmol/L): 8.1 mmol/L

## 2024-01-22 MED FILL — Empagliflozin Tab 10 MG: ORAL | 30 days supply | Qty: 30 | Fill #3 | Status: AC

## 2024-02-03 ENCOUNTER — Other Ambulatory Visit: Payer: Self-pay

## 2024-02-03 MED FILL — Finerenone Tab 10 MG: ORAL | 30 days supply | Qty: 30 | Fill #1 | Status: CN

## 2024-02-13 ENCOUNTER — Other Ambulatory Visit: Payer: Self-pay

## 2024-02-14 ENCOUNTER — Other Ambulatory Visit: Payer: Self-pay

## 2024-02-14 MED FILL — Finerenone Tab 10 MG: ORAL | 30 days supply | Qty: 30 | Fill #1 | Status: AC

## 2024-02-26 ENCOUNTER — Other Ambulatory Visit: Payer: Self-pay

## 2024-03-03 ENCOUNTER — Other Ambulatory Visit: Payer: Self-pay | Admitting: Family

## 2024-03-03 ENCOUNTER — Other Ambulatory Visit: Payer: Self-pay

## 2024-03-03 MED ORDER — EMPAGLIFLOZIN 10 MG PO TABS
10.0000 mg | ORAL_TABLET | Freq: Every day | ORAL | 3 refills | Status: AC
  Filled 2024-03-03: qty 30, 30d supply, fill #0
  Filled 2024-03-28: qty 30, 30d supply, fill #1
  Filled 2024-04-20 – 2024-04-21 (×2): qty 30, 30d supply, fill #2
  Filled 2024-06-01 – 2024-06-04 (×2): qty 30, 30d supply, fill #3

## 2024-03-04 ENCOUNTER — Other Ambulatory Visit: Payer: Self-pay

## 2024-03-17 ENCOUNTER — Other Ambulatory Visit: Payer: Self-pay

## 2024-03-17 ENCOUNTER — Other Ambulatory Visit: Payer: Self-pay | Admitting: Internal Medicine

## 2024-03-17 DIAGNOSIS — E1165 Type 2 diabetes mellitus with hyperglycemia: Secondary | ICD-10-CM

## 2024-03-17 MED FILL — Finerenone Tab 10 MG: ORAL | 30 days supply | Qty: 30 | Fill #2 | Status: AC

## 2024-03-17 MED FILL — Lisinopril & Hydrochlorothiazide Tab 20-12.5 MG: ORAL | 90 days supply | Qty: 90 | Fill #2 | Status: AC

## 2024-03-19 ENCOUNTER — Other Ambulatory Visit: Payer: Self-pay

## 2024-03-20 ENCOUNTER — Other Ambulatory Visit: Payer: Self-pay

## 2024-03-20 MED FILL — Continuous Glucose System Sensor: 30 days supply | Qty: 2 | Fill #0 | Status: CN

## 2024-03-20 NOTE — Telephone Encounter (Signed)
 Requested medication (s) are due for refill today - yes  Requested medication (s) are on the active medication list -yes  Future visit scheduled -yes  Last refill: 06/11/23 2 each 6RF  Notes to clinic: no protocol listed  Requested Prescriptions  Pending Prescriptions Disp Refills   Continuous Glucose Sensor (FREESTYLE LIBRE 3 PLUS SENSOR) MISC [Pharmacy Med Name: Continuous Glucose Sensor (FREESTYLE LIBRE 3 PLUS SENSOR) Misc] 2 each 6    Sig: Change sensor every 15 days.     There is no refill protocol information for this order       Requested Prescriptions  Pending Prescriptions Disp Refills   Continuous Glucose Sensor (FREESTYLE LIBRE 3 PLUS SENSOR) MISC [Pharmacy Med Name: Continuous Glucose Sensor (FREESTYLE LIBRE 3 PLUS SENSOR) Misc] 2 each 6    Sig: Change sensor every 15 days.     There is no refill protocol information for this order

## 2024-03-23 ENCOUNTER — Other Ambulatory Visit: Payer: Self-pay | Admitting: Internal Medicine

## 2024-03-23 ENCOUNTER — Other Ambulatory Visit: Payer: Self-pay

## 2024-03-23 DIAGNOSIS — E1165 Type 2 diabetes mellitus with hyperglycemia: Secondary | ICD-10-CM

## 2024-03-24 ENCOUNTER — Other Ambulatory Visit: Payer: Self-pay

## 2024-03-24 NOTE — Telephone Encounter (Signed)
 Too soon for refill.  Requested Prescriptions  Pending Prescriptions Disp Refills   Continuous Glucose Sensor (FREESTYLE LIBRE 3 PLUS SENSOR) MISC 2 each 6    Sig: Change sensor every 15 days.     There is no refill protocol information for this order

## 2024-03-28 MED FILL — Glipizide Tab ER 24HR 10 MG: ORAL | 90 days supply | Qty: 90 | Fill #2 | Status: AC

## 2024-03-30 ENCOUNTER — Other Ambulatory Visit: Payer: Self-pay

## 2024-03-31 ENCOUNTER — Other Ambulatory Visit: Payer: Self-pay

## 2024-03-31 MED FILL — Continuous Glucose System Sensor: 28 days supply | Qty: 2 | Fill #0 | Status: AC

## 2024-04-15 ENCOUNTER — Other Ambulatory Visit: Payer: Self-pay

## 2024-04-15 MED FILL — Continuous Glucose System Sensor: 28 days supply | Qty: 2 | Fill #1 | Status: AC

## 2024-04-15 MED FILL — Finerenone Tab 10 MG: ORAL | 30 days supply | Qty: 30 | Fill #3 | Status: CN

## 2024-04-20 ENCOUNTER — Other Ambulatory Visit: Payer: Self-pay

## 2024-04-20 MED FILL — Finerenone Tab 10 MG: ORAL | 30 days supply | Qty: 30 | Fill #3 | Status: CN

## 2024-04-21 ENCOUNTER — Other Ambulatory Visit: Payer: Self-pay | Admitting: Internal Medicine

## 2024-04-21 ENCOUNTER — Other Ambulatory Visit: Payer: Self-pay

## 2024-04-21 MED FILL — Finerenone Tab 10 MG: ORAL | 30 days supply | Qty: 30 | Fill #3 | Status: CN

## 2024-04-22 NOTE — Telephone Encounter (Signed)
 Requested medication (s) are due for refill today: yes  Requested medication (s) are on the active medication list: yes  Last refill:  01/01/24  Future visit scheduled: yes  Notes to clinic:  Unable to refill per protocol, last refill by another provider.      Requested Prescriptions  Pending Prescriptions Disp Refills   KERENDIA  10 MG TABS [Pharmacy Med Name: Finerenone  (KERENDIA ) 10 MG TABS] 30 tablet 3    Sig: Take 1 tablet (10 mg total) by mouth daily.     Off-Protocol Failed - 04/22/2024  4:16 PM      Failed - Medication not assigned to a protocol, review manually.      Passed - Valid encounter within last 12 months    Recent Outpatient Visits           3 months ago Type 2 diabetes mellitus with hyperglycemia, without long-term current use of insulin [facility])   [place] [facility] Bernardo Fend, [facility]   6 months ago Type 2 diabetes mellitus with hyperglycemia, without long-term current use of insulin [facility])   [place] [facility] Bernardo Fend, DO   9 months ago Type 2 diabetes mellitus with hyperglycemia, without long-term current use of insulin [facility])    [facility] Bernardo Fend, DO   10 months ago Type 2 diabetes mellitus with hyperglycemia, without long-term current use of insulin [facility])   [facility] Bernardo Fend, [place]

## 2024-04-23 ENCOUNTER — Encounter: Payer: Self-pay | Admitting: Internal Medicine

## 2024-04-24 ENCOUNTER — Other Ambulatory Visit: Payer: Self-pay

## 2024-04-25 ENCOUNTER — Other Ambulatory Visit: Payer: Self-pay

## 2024-04-25 MED FILL — Finerenone Tab 10 MG: ORAL | 30 days supply | Qty: 30 | Fill #0 | Status: AC

## 2024-04-27 ENCOUNTER — Other Ambulatory Visit: Payer: Self-pay | Admitting: Internal Medicine

## 2024-04-27 ENCOUNTER — Other Ambulatory Visit: Payer: Self-pay

## 2024-05-21 ENCOUNTER — Other Ambulatory Visit: Payer: Self-pay | Admitting: Internal Medicine

## 2024-05-21 MED FILL — Finerenone Tab 10 MG: ORAL | 30 days supply | Qty: 30 | Fill #1 | Status: AC

## 2024-05-22 ENCOUNTER — Other Ambulatory Visit: Payer: Self-pay

## 2024-05-22 MED ORDER — VENLAFAXINE HCL ER 150 MG PO CP24
150.0000 mg | ORAL_CAPSULE | Freq: Every day | ORAL | 0 refills | Status: AC
  Filled 2024-05-22: qty 90, 90d supply, fill #0

## 2024-05-22 MED ORDER — VENLAFAXINE HCL ER 37.5 MG PO CP24
37.5000 mg | ORAL_CAPSULE | Freq: Every day | ORAL | 0 refills | Status: AC
  Filled 2024-05-22: qty 90, 90d supply, fill #0

## 2024-05-22 NOTE — Telephone Encounter (Signed)
 SABRA Requested Prescriptions  Pending Prescriptions Disp Refills   venlafaxine  XR (EFFEXOR -XR) 150 MG 24 hr capsule 90 capsule 0    Sig: Take 1 capsule (150 mg total) by mouth daily. Take with 37.5mg  for total of 187.5mg  daily.     Psychiatry: Antidepressants - SNRI - desvenlafaxine & venlafaxine  Failed - 05/22/2024 10:58 AM      Failed - Cr in normal range and within 360 days    Creat  Date Value Ref Range Status  01/21/2024 0.46 (L) 0.50 - 1.03 mg/dL Final   Creatinine, POC  Date Value Ref Range Status  03/21/2023 50 mg/dL Final         Failed - Lipid Panel in normal range within the last 12 months    Cholesterol, Total  Date Value Ref Range Status  03/13/2023 149 100 - 199 mg/dL Final   Cholesterol  Date Value Ref Range Status  01/21/2024 159 <200 mg/dL Final   LDL Cholesterol (Calc)  Date Value Ref Range Status  01/21/2024 97 mg/dL (calc) Final    Comment:    Reference range: <100 . Desirable range <100 mg/dL for primary prevention;   <70 mg/dL for patients with CHD or diabetic patients  with > or = 2 CHD risk factors. SABRA LDL-C is now calculated using the Martin-Hopkins  calculation, which is a validated novel method providing  better accuracy than the Friedewald equation in the  estimation of LDL-C.  Gladis APPLETHWAITE et al. SANDREA. 7986;689(80): 2061-2068  (http://education.[facility].com/faq/FAQ164)    HDL  Date Value Ref Range Status  01/21/2024 39 (L) > OR = 50 mg/dL Final  88/86/7975 39 (L) >39 mg/dL Final   Triglycerides  Date Value Ref Range Status  01/21/2024 124 <150 mg/dL Final         Passed - Completed PHQ-2 or PHQ-9 in the last 360 days      Passed - Last BP in normal range    BP Readings from Last 1 Encounters:  01/21/24 120/82         Passed - Valid encounter within last 6 months    Recent Outpatient Visits           4 months ago Type 2 diabetes mellitus with hyperglycemia, without long-term current use of insulin [facility])   [place]  [facility]end, DO   7 months ago Type 2 diabetes mellitus with hyperglycemia, without long-term current use of insulin [facility])   [place] [facility] Bernardo Fend, DO   10 months ago Type 2 diabetes mellitus with hyperglycemia, without long-term current use of insulin [facility])   [place] [facility]ardo Fend, DO   11 months ago Type 2 diabetes mellitus with hyperglycemia, without long-term current use of insulin [facility])   [place] [facility] Bernardo Fend, DO               venlafaxine  XR (EFFEXOR -XR) 37.5 MG 24 hr capsule 90 capsule 0    Sig: Take 1 capsule (37.5 mg total) by mouth daily. Take with 150mg  for total of 187.5mg  daily.     Psychiatry: Antidepressants - SNRI - desvenlafaxine & venlafaxine  Failed - 05/22/2024 10:58 AM      Failed - Cr in normal range and within 360 days    Creat  Date Value Ref Range Status  01/21/2024 0.46 (L) 0.50 - 1.03 mg/dL Final   Creatinine, POC  Date Value Ref Range Status  03/21/2023 50 mg/dL Final  Failed - Lipid Panel in normal range within the last 12 months    Cholesterol, Total  Date Value Ref Range Status  03/13/2023 149 100 - 199 mg/dL Final   Cholesterol  Date Value Ref Range Status  01/21/2024 159 <200 mg/dL Final   LDL Cholesterol (Calc)  Date Value Ref Range Status  01/21/2024 97 mg/dL (calc) Final    Comment:    Reference range: <100 . Desirable range <100 mg/dL for primary prevention;   <70 mg/dL for patients with CHD or diabetic patients  with > or = 2 CHD risk factors. SABRA LDL-C is now calculated using the Martin-Hopkins  calculation, which is a validated novel method providing  better accuracy than the Friedewald equation in the  estimation of LDL-C.  Gladis APPLETHWAITE et al. SANDREA. 7986;689(80): 2061-2068  (http://education.[facility].com/faq/FAQ164)    HDL  Date Value Ref Range Status  01/21/2024 39 (L) >  OR = 50 mg/dL Final  88/86/7975 39 (L) >39 mg/dL Final   Triglycerides  Date Value Ref Range Status  01/21/2024 124 <150 mg/dL Final         Passed - Completed PHQ-2 or PHQ-9 in the last 360 days      Passed - Last BP in normal range    BP Readings from Last 1 Encounters:  01/21/24 120/82         Passed - Valid encounter within last 6 months    Recent Outpatient Visits           4 months ago Type 2 diabetes mellitus with hyperglycemia, without long-term current use of insulin [facility])   [place] [facility] Bernardo Fend, DO   7 months ago Type 2 diabetes mellitus with hyperglycemia, without long-term current use of insulin [facility])   [place] [facility] Bern[facility]end, DO   10 months ago Type 2 diabetes mellitus with hyperglycemia, without long-term current use of insulin [facility])   [place] [facility] Bernardo Fend, DO   11 months ago Type 2 diabetes mellitus with hyperglycemia, without long-term current use of insulin [facility])   [facility] Bernardo Fend, [place]

## 2024-06-01 ENCOUNTER — Other Ambulatory Visit: Payer: Self-pay | Admitting: Internal Medicine

## 2024-06-01 ENCOUNTER — Other Ambulatory Visit (HOSPITAL_COMMUNITY): Payer: Self-pay

## 2024-06-01 MED FILL — Continuous Glucose System Sensor: 28 days supply | Qty: 2 | Fill #2 | Status: AC

## 2024-06-02 ENCOUNTER — Other Ambulatory Visit: Payer: Self-pay

## 2024-06-02 MED ORDER — KERENDIA 10 MG PO TABS
10.0000 mg | ORAL_TABLET | Freq: Every day | ORAL | 1 refills | Status: AC
  Filled 2024-06-02: qty 90, 90d supply, fill #0

## 2024-06-02 NOTE — Telephone Encounter (Signed)
 Requested medications are due for refill today.  Pt needs rx as a 90 day supply for insurance  Requested medications are on the active medications list.  yes  Last refill. 04/25/2024 #30 3 rf  Future visit scheduled.   yes  Notes to clinic.  Pharmacy comment: pt needs a nwe rx for 90 days supply to meet ins requirements, rx only has 30 tabs remaining     Requested Prescriptions  Pending Prescriptions Disp Refills   Finerenone  (KERENDIA ) 10 MG TABS 30 tablet 3    Sig: Take 1 tablet (10 mg total) by mouth daily.     Off-Protocol Failed - 06/02/2024  2:30 PM      Failed - Medication not assigned to a protocol, review manually.      Passed - Valid encounter within last 12 months    Recent Outpatient Visits           4 months ago Type 2 diabetes mellitus with hyperglycemia, without long-term current use of insulin [facility])   [place] [facility] Bernardo Fend, DO   7 months ago Type 2 diabetes mellitus with hyperglycemia, without long-term current use of insulin [facility])   [place] [facility] Bernardo Fend, DO   10 months ago Type 2 diabetes mellitus with hyperglycemia, without long-term current use of insulin [facility])   [place] [facility] Bernardo Fend, DO   11 months ago Type 2 diabetes mellitus with hyperglycemia, without long-term current use of insulin [facility])   [facility] Bernardo Fend, [place]

## 2024-07-21 ENCOUNTER — Ambulatory Visit: Admitting: Internal Medicine
